# Patient Record
Sex: Female | Born: 1965 | Race: Black or African American | Hispanic: No | Marital: Single | Smoking: Never smoker
Health system: Southern US, Community
[De-identification: ages and names within clinical notes are randomized; demographics above are authoritative.]

## PROBLEM LIST (undated history)

## (undated) DIAGNOSIS — N809 Endometriosis, unspecified: Secondary | ICD-10-CM

## (undated) DIAGNOSIS — R002 Palpitations: Secondary | ICD-10-CM

## (undated) DIAGNOSIS — E559 Vitamin D deficiency, unspecified: Secondary | ICD-10-CM

## (undated) DIAGNOSIS — D219 Benign neoplasm of connective and other soft tissue, unspecified: Secondary | ICD-10-CM

## (undated) DIAGNOSIS — M1990 Unspecified osteoarthritis, unspecified site: Secondary | ICD-10-CM

## (undated) DIAGNOSIS — D649 Anemia, unspecified: Secondary | ICD-10-CM

## (undated) HISTORY — DX: Vitamin D deficiency, unspecified: E55.9

## (undated) HISTORY — DX: Benign neoplasm of connective and other soft tissue, unspecified: D21.9

## (undated) HISTORY — PX: ABDOMINAL HYSTERECTOMY: SHX81

## (undated) HISTORY — DX: Unspecified osteoarthritis, unspecified site: M19.90

## (undated) HISTORY — DX: Anemia, unspecified: D64.9

## (undated) HISTORY — DX: Endometriosis, unspecified: N80.9

## (undated) HISTORY — PX: GALLBLADDER SURGERY: SHX652

## (undated) HISTORY — DX: Palpitations: R00.2

---

## 2004-09-20 ENCOUNTER — Emergency Department: Payer: Self-pay | Admitting: Emergency Medicine

## 2005-07-16 ENCOUNTER — Emergency Department: Payer: Self-pay | Admitting: Emergency Medicine

## 2009-12-04 ENCOUNTER — Emergency Department: Payer: Self-pay | Admitting: Emergency Medicine

## 2011-02-09 ENCOUNTER — Ambulatory Visit: Payer: Self-pay | Admitting: Family Medicine

## 2012-09-20 ENCOUNTER — Ambulatory Visit: Payer: Self-pay | Admitting: Emergency Medicine

## 2012-09-20 LAB — HEPATIC FUNCTION PANEL A (ARMC)
Albumin: 3.9 g/dL (ref 3.4–5.0)
Alkaline Phosphatase: 87 U/L (ref 50–136)
Bilirubin,Total: 0.6 mg/dL (ref 0.2–1.0)
SGOT(AST): 24 U/L (ref 15–37)
SGPT (ALT): 20 U/L (ref 12–78)

## 2012-09-20 LAB — HEMOGLOBIN: HGB: 11.1 g/dL — ABNORMAL LOW (ref 12.0–16.0)

## 2013-09-19 ENCOUNTER — Ambulatory Visit: Payer: Self-pay | Admitting: Gastroenterology

## 2015-03-03 NOTE — Op Note (Signed)
PATIENT NAME:  Emma Hall, HEIDEL MR#:  [id number] DATE OF BIRTH:  1966-01-22  DATE OF PROCEDURE:  09/20/2012  PREOPERATIVE DIAGNOSIS: Acute cholecystitis.   POSTOPERATIVE DIAGNOSIS: Acute cholecystitis.   OPERATION: Laparoscopic cholecystectomy.   SURGEON: Massa Pe S. Cleola Perryman, MD   HISTORY: This patient was seen by me with cholelithiasis and abdominal pain, right upper quadrant, was then brought to surgery.   PROCEDURE: Under general anesthesia the abdomen was then prepped and draped. A small incision was made above the umbilicus. After cutting skin and subcutaneous tissue, the fascia was cut. The abdomen was entered with a Hassan under direct vision. Another trocar was put in the epigastric region. Two 5 mm were put in the right upper quadrant of the abdomen. The gallbladder was enlarged. It was lifted up and the dissection was done to find out where the cystic artery was. After dissecting at the cystic duct and gallbladder junction, the cystic artery was then dissected out nicely and the cystic duct was also dissected out very nicely. The cystic artery was then clipped and cut. When we were doing the cholangiogram the needle poked through the other side of the gallbladder so then we reapplied the needle. It did leak out the contrast. I could not see anything so we decided that we could dissect out very nicely and make sure where the cystic duct enters. I could see the common duct on this lady and after I dissected out the cystic duct very nicely I put two clips proximally and one distally and then cut the cystic duct and gallbladder was then removed from the liver bed. Her preop liver functions were normal. Gallbladder came out with no evidence of any biliary leak. There was some bleeding from the tip of the cystic duct. I put a new clip on it because it looked small cystic artery was bleeding at the tip but it stopped and after washing out the gallbladder was okay and then gallbladder was removed from the  umbilical port. After this was all done I made sure all the ports were not bleeding and then we closed the umbilical port with interrupted 0 Vicryl sutures and staples applied.     The patient tolerated the procedure well and was sent to the recovery room in satisfactory condition.   ____________________________ Welford Roche Phylis Bougie, MD msh:drc D: 09/20/2012 12:30:10 ET T: 09/20/2012 13:21:30 ET JOB#: [id number]  cc: Sanjith Siwek S. Phylis Bougie, MD, <Dictator> Sharene Butters MD ELECTRONICALLY SIGNED 09/21/2012 15:10

## 2017-12-12 ENCOUNTER — Ambulatory Visit (INDEPENDENT_AMBULATORY_CARE_PROVIDER_SITE_OTHER): Payer: BC Managed Care – PPO | Admitting: Obstetrics and Gynecology

## 2017-12-12 ENCOUNTER — Encounter: Payer: Self-pay | Admitting: Obstetrics and Gynecology

## 2017-12-12 VITALS — BP 127/78 | HR 87 | Ht 67.0 in | Wt 179.0 lb

## 2017-12-12 DIAGNOSIS — Z1231 Encounter for screening mammogram for malignant neoplasm of breast: Secondary | ICD-10-CM | POA: Diagnosis not present

## 2017-12-12 DIAGNOSIS — E663 Overweight: Secondary | ICD-10-CM

## 2017-12-12 DIAGNOSIS — Z01419 Encounter for gynecological examination (general) (routine) without abnormal findings: Secondary | ICD-10-CM | POA: Diagnosis not present

## 2017-12-12 DIAGNOSIS — K5901 Slow transit constipation: Secondary | ICD-10-CM

## 2017-12-12 DIAGNOSIS — Z79890 Hormone replacement therapy (postmenopausal): Secondary | ICD-10-CM | POA: Diagnosis not present

## 2017-12-12 DIAGNOSIS — Z13220 Encounter for screening for lipoid disorders: Secondary | ICD-10-CM | POA: Diagnosis not present

## 2017-12-12 DIAGNOSIS — Z1321 Encounter for screening for nutritional disorder: Secondary | ICD-10-CM | POA: Diagnosis not present

## 2017-12-12 NOTE — Patient Instructions (Addendum)
Health Maintenance for Postmenopausal Women Menopause is a normal process in which your reproductive ability comes to an end. This process happens gradually over a span of months to years, usually between the ages of 22 and 9. Menopause is complete when you have missed 12 consecutive menstrual periods. It is important to talk with your health care provider about some of the most common conditions that affect postmenopausal women, such as heart disease, cancer, and bone loss (osteoporosis). Adopting a healthy lifestyle and getting preventive care can help to promote your health and wellness. Those actions can also lower your chances of developing some of these common conditions. What should I know about menopause? During menopause, you may experience a number of symptoms, such as:  Moderate-to-severe hot flashes.  Night sweats.  Decrease in sex drive.  Mood swings.  Headaches.  Tiredness.  Irritability.  Memory problems.  Insomnia.  Choosing to treat or not to treat menopausal changes is an individual decision that you make with your health care provider. What should I know about hormone replacement therapy and supplements? Hormone therapy products are effective for treating symptoms that are associated with menopause, such as hot flashes and night sweats. Hormone replacement carries certain risks, especially as you become older. If you are thinking about using estrogen or estrogen with progestin treatments, discuss the benefits and risks with your health care provider. What should I know about heart disease and stroke? Heart disease, heart attack, and stroke become more likely as you age. This may be due, in part, to the hormonal changes that your body experiences during menopause. These can affect how your body processes dietary fats, triglycerides, and cholesterol. Heart attack and stroke are both medical emergencies. There are many things that you can do to help prevent heart disease  and stroke:  Have your blood pressure checked at least every 1-2 years. High blood pressure causes heart disease and increases the risk of stroke.  If you are 53-22 years old, ask your health care provider if you should take aspirin to prevent a heart attack or a stroke.  Do not use any tobacco products, including cigarettes, chewing tobacco, or electronic cigarettes. If you need help quitting, ask your health care provider.  It is important to eat a healthy diet and maintain a healthy weight. ? Be sure to include plenty of vegetables, fruits, low-fat dairy products, and lean protein. ? Avoid eating foods that are high in solid fats, added sugars, or salt (sodium).  Get regular exercise. This is one of the most important things that you can do for your health. ? Try to exercise for at least 150 minutes each week. The type of exercise that you do should increase your heart rate and make you sweat. This is known as moderate-intensity exercise. ? Try to do strengthening exercises at least twice each week. Do these in addition to the moderate-intensity exercise.  Know your numbers.Ask your health care provider to check your cholesterol and your blood glucose. Continue to have your blood tested as directed by your health care provider.  What should I know about cancer screening? There are several types of cancer. Take the following steps to reduce your risk and to catch any cancer development as early as possible. Breast Cancer  Practice breast self-awareness. ? This means understanding how your breasts normally appear and feel. ? It also means doing regular breast self-exams. Let your health care provider know about any changes, no matter how small.  If you are 40  or older, have a clinician do a breast exam (clinical breast exam or CBE) every year. Depending on your age, family history, and medical history, it may be recommended that you also have a yearly breast X-ray (mammogram).  If you  have a family history of breast cancer, talk with your health care provider about genetic screening.  If you are at high risk for breast cancer, talk with your health care provider about having an MRI and a mammogram every year.  Breast cancer (BRCA) gene test is recommended for women who have family members with BRCA-related cancers. Results of the assessment will determine the need for genetic counseling and BRCA1 and for BRCA2 testing. BRCA-related cancers include these types: ? Breast. This occurs in males or females. ? Ovarian. ? Tubal. This may also be called fallopian tube cancer. ? Cancer of the abdominal or pelvic lining (peritoneal cancer). ? Prostate. ? Pancreatic.  Cervical, Uterine, and Ovarian Cancer Your health care provider may recommend that you be screened regularly for cancer of the pelvic organs. These include your ovaries, uterus, and vagina. This screening involves a pelvic exam, which includes checking for microscopic changes to the surface of your cervix (Pap test).  For women ages 21-65, health care providers may recommend a pelvic exam and a Pap test every three years. For women ages 79-65, they may recommend the Pap test and pelvic exam, combined with testing for human papilloma virus (HPV), every five years. Some types of HPV increase your risk of cervical cancer. Testing for HPV may also be done on women of any age who have unclear Pap test results.  Other health care providers may not recommend any screening for nonpregnant women who are considered low risk for pelvic cancer and have no symptoms. Ask your health care provider if a screening pelvic exam is right for you.  If you have had past treatment for cervical cancer or a condition that could lead to cancer, you need Pap tests and screening for cancer for at least 20 years after your treatment. If Pap tests have been discontinued for you, your risk factors (such as having a new sexual partner) need to be  reassessed to determine if you should start having screenings again. Some women have medical problems that increase the chance of getting cervical cancer. In these cases, your health care provider may recommend that you have screening and Pap tests more often.  If you have a family history of uterine cancer or ovarian cancer, talk with your health care provider about genetic screening.  If you have vaginal bleeding after reaching menopause, tell your health care provider.  There are currently no reliable tests available to screen for ovarian cancer.  Lung Cancer Lung cancer screening is recommended for adults 69-62 years old who are at high risk for lung cancer because of a history of smoking. A yearly low-dose CT scan of the lungs is recommended if you:  Currently smoke.  Have a history of at least 30 pack-years of smoking and you currently smoke or have quit within the past 15 years. A pack-year is smoking an average of one pack of cigarettes per day for one year.  Yearly screening should:  Continue until it has been 15 years since you quit.  Stop if you develop a health problem that would prevent you from having lung cancer treatment.  Colorectal Cancer  This type of cancer can be detected and can often be prevented.  Routine colorectal cancer screening usually begins at  age 42 and continues through age 45.  If you have risk factors for colon cancer, your health care provider may recommend that you be screened at an earlier age.  If you have a family history of colorectal cancer, talk with your health care provider about genetic screening.  Your health care provider may also recommend using home test kits to check for hidden blood in your stool.  A small camera at the end of a tube can be used to examine your colon directly (sigmoidoscopy or colonoscopy). This is done to check for the earliest forms of colorectal cancer.  Direct examination of the colon should be repeated every  5-10 years until age 71. However, if early forms of precancerous polyps or small growths are found or if you have a family history or genetic risk for colorectal cancer, you may need to be screened more often.  Skin Cancer  Check your skin from head to toe regularly.  Monitor any moles. Be sure to tell your health care provider: ? About any new moles or changes in moles, especially if there is a change in a mole's shape or color. ? If you have a mole that is larger than the size of a pencil eraser.  If any of your family members has a history of skin cancer, especially at a young age, talk with your health care provider about genetic screening.  Always use sunscreen. Apply sunscreen liberally and repeatedly throughout the day.  Whenever you are outside, protect yourself by wearing long sleeves, pants, a wide-brimmed hat, and sunglasses.  What should I know about osteoporosis? Osteoporosis is a condition in which bone destruction happens more quickly than new bone creation. After menopause, you may be at an increased risk for osteoporosis. To help prevent osteoporosis or the bone fractures that can happen because of osteoporosis, the following is recommended:  If you are 46-71 years old, get at least 1,000 mg of calcium and at least 600 mg of vitamin D per day.  If you are older than age 55 but younger than age 65, get at least 1,200 mg of calcium and at least 600 mg of vitamin D per day.  If you are older than age 54, get at least 1,200 mg of calcium and at least 800 mg of vitamin D per day.  Smoking and excessive alcohol intake increase the risk of osteoporosis. Eat foods that are rich in calcium and vitamin D, and do weight-bearing exercises several times each week as directed by your health care provider. What should I know about how menopause affects my mental health? Depression may occur at any age, but it is more common as you become older. Common symptoms of depression  include:  Low or sad mood.  Changes in sleep patterns.  Changes in appetite or eating patterns.  Feeling an overall lack of motivation or enjoyment of activities that you previously enjoyed.  Frequent crying spells.  Talk with your health care provider if you think that you are experiencing depression. What should I know about immunizations? It is important that you get and maintain your immunizations. These include:  Tetanus, diphtheria, and pertussis (Tdap) booster vaccine.  Influenza every year before the flu season begins.  Pneumonia vaccine.  Shingles vaccine.  Your health care provider may also recommend other immunizations. This information is not intended to replace advice given to you by your health care provider. Make sure you discuss any questions you have with your health care provider. Document Released: 12/23/2005  Document Revised: 05/20/2016 Document Reviewed: 08/04/2015 Elsevier Interactive Patient Education  2018 [employer]  

## 2017-12-12 NOTE — Progress Notes (Signed)
ANNUAL PREVENTATIVE CARE GYNECOLOGY  ENCOUNTER NOTE  Subjective:       Emma Hall is a 52 y.o. G2P2 female here to establish care, and for a routine annual gynecologic exam. The patient is sexually active. The patient is taking hormone replacement therapy (OTC Estroven daily, x 1 year). Patient denies post-menopausal vaginal bleeding. The patient wears seatbelts: yes. The patient participates in regular exercise: no. Has the patient ever been transfused or tattooed?: no. The patient reports that there is not domestic violence in her life.  Current complaints: 1.  Constipation - she reports that she has dealt with chronic constipation over the past 2 years or so.  She reports that she usually takes fiber tablets and drinks water, which allows her to have a BM ever 2-3 days.  If she does not take the supplements, she may go up to 1 week before having a BM.     Gynecologic History No LMP recorded. Patient has had a hysterectomy. Contraception: status post hysterectomy Last Pap: 2015. Results were: normal Last mammogram: 2015. Results were: normal Last Colonoscopy: ~ 5 or 6 years ago. Results were: normal.   Obstetric History OB History  Gravida Para Term Preterm AB Living  2 2          SAB TAB Ectopic Multiple Live Births               # Outcome Date GA Lbr Len/2nd Weight Sex Delivery Anes PTL Lv  2 Para 1991    F CS-LTranv     1 Para 1989    M CS-LTranv         Past Medical History:  Diagnosis Date  . Anemia   . Endometriosis   . Fibroid     History reviewed. No pertinent family history.  Past Surgical History:  Procedure Laterality Date  . ABDOMINAL HYSTERECTOMY    . GALLBLADDER SURGERY      Social History   Socioeconomic History  . Marital status: Married    Spouse name: Not on file  . Number of children: Not on file  . Years of education: Not on file  . Highest education level: Not on file  Social Needs  . Financial resource strain: Not on file  .  Food insecurity - worry: Not on file  . Food insecurity - inability: Not on file  . Transportation needs - medical: Not on file  . Transportation needs - non-medical: Not on file  Occupational History  . Not on file  Tobacco Use  . Smoking status: Never Smoker  . Smokeless tobacco: Never Used  Substance and Sexual Activity  . Alcohol use: No    Frequency: Never  . Drug use: No  . Sexual activity: No  Other Topics Concern  . Not on file  Social History Narrative  . Not on file    Current Outpatient Medications on File Prior to Visit  Medication Sig Dispense Refill  . ferrous sulfate 325 (65 FE) MG EC tablet Take 325 mg by mouth 3 (three) times daily with meals.     No current facility-administered medications on file prior to visit.     No Known Allergies    Review of Systems ROS Review of Systems - General ROS: negative for - chills, fatigue, fever, hot flashes, night sweats, weight gain or weight loss Psychological ROS: negative for - anxiety, decreased libido, depression, mood swings, physical abuse or sexual abuse Ophthalmic ROS: negative for - blurry vision, eye pain or  loss of vision ENT ROS: negative for - headaches, hearing change, visual changes or vocal changes Allergy and Immunology ROS: negative for - hives, itchy/watery eyes or seasonal allergies Hematological and Lymphatic ROS: negative for - bleeding problems, bruising, swollen lymph nodes or weight loss Endocrine ROS: negative for - galactorrhea, hair pattern changes, hot flashes, malaise/lethargy, mood swings, palpitations, polydipsia/polyuria, skin changes, temperature intolerance or unexpected weight changes Breast ROS: negative for - new or changing breast lumps or nipple discharge Respiratory ROS: negative for - cough or shortness of breath Cardiovascular ROS: negative for - chest pain, irregular heartbeat, palpitations or shortness of breath Gastrointestinal ROS: no abdominal pain, change in bowel  habits, or black or bloody stools.  Positive for constipation (see HPI) Genito-Urinary ROS: no dysuria, trouble voiding, or hematuria Musculoskeletal ROS: negative for - joint pain or joint stiffness Neurological ROS: negative for - bowel and bladder control changes Dermatological ROS: negative for rash and skin lesion changes   Objective:   BP 127/78   Pulse 87   Ht 5\' 7"  (1.702 m)   Wt 179 lb (81.2 kg)   BMI 28.04 kg/m  CONSTITUTIONAL: Well-developed, well-nourished female in no acute distress. Overweight.  PSYCHIATRIC: Normal mood and affect. Normal behavior. Normal judgment and thought content. Thomas: Alert and oriented to person, place, and time. Normal muscle tone coordination. No cranial nerve deficit noted. HENT:  Normocephalic, atraumatic, External right and left ear normal. Oropharynx is clear and moist EYES: Conjunctivae and EOM are normal. Pupils are equal, round, and reactive to light. No scleral icterus.  NECK: Normal range of motion, supple, no masses.  Normal thyroid.  SKIN: Skin is warm and dry. No rash noted. Not diaphoretic. No erythema. No pallor. CARDIOVASCULAR: Normal heart rate noted, regular rhythm, no murmur. RESPIRATORY: Clear to auscultation bilaterally. Effort and breath sounds normal, no problems with respiration noted. BREASTS: Symmetric in size. No masses, skin changes, nipple drainage, or lymphadenopathy. ABDOMEN: Soft, normal bowel sounds, no distention noted.  No tenderness, rebound or guarding.  BLADDER: Normal PELVIC:  Bladder no bladder distension noted  Urethra: normal appearing urethra with no masses, tenderness or lesions  Vulva: normal appearing vulva with no masses, tenderness or lesions  Vagina: normal appearing vagina with normal color and discharge, no lesions  Cervix: normal appearing cervix without discharge or lesions  Uterus: uterus is normal size, shape, consistency and nontender  Adnexa: normal adnexa in size, nontender and no  masses  RV: External Exam NormaI, No Rectal Masses and Normal Sphincter tone  MUSCULOSKELETAL: Normal range of motion. No tenderness.  No cyanosis, clubbing, or edema.  2+ distal pulses. LYMPHATIC: No Axillary, Supraclavicular, or Inguinal Adenopathy.   Labs: Lab Results  Component Value Date   HGB 11.1 (L) 09/20/2012    No results found for: CREATININE, BUN, NA, K, CL, CO2  Lab Results  Component Value Date   ALT 20 09/20/2012   AST 24 09/20/2012   ALKPHOS 87 09/20/2012   BILITOT 0.6 09/20/2012    No results found for: CHOL, HDL, LDLCALC, LDLDIRECT, TRIG, CHOLHDL  No results found for: TSH  No results found for: HGBA1C   Assessment:   Annual gynecologic examination 52 y.o. Contraception: post menopausal status Overweight  Breast cancer screening Cervical cancer screening Constipation Menopausal on HRT  Plan:  - Pap: Pap Co Test ordered. - Mammogram: Ordered - Stool Guaiac Testing:  Not Indicated. Patient up to date with colonoscopy.  - Labs: CBC, CMP, Lipid 1, TSH and Vit D Level""done -  Routine preventative health maintenance measures emphasized: Exercise/Diet/Weight control, Alcohol/Substance use risks and Stress Management - Constipation not relieved with conservative management. Also notes she has used laxatives as needed.  Will give sample of Linzess 145 mg daily for likely slow transit.  If this helps, can prescribe. Patient will call provider to notify if improvement.  - Menopausal, vasomotor symptoms controlled with OTC Estroven. Counseled on risks and benefits.  Patient has used only x 1 year.   - She has already received flu vaccine from work.  - Return to [place], MD [facility]

## 2017-12-13 LAB — CBC
Hematocrit: 32.6 % — ABNORMAL LOW (ref 34.0–46.6)
Hemoglobin: 10.9 g/dL — ABNORMAL LOW (ref 11.1–15.9)
MCH: 27.7 pg (ref 26.6–33.0)
MCHC: 33.4 g/dL (ref 31.5–35.7)
MCV: 83 fL (ref 79–97)
PLATELETS: 337 10*3/uL (ref 150–379)
RBC: 3.93 x10E6/uL (ref 3.77–5.28)
RDW: 14.9 % (ref 12.3–15.4)
WBC: 7.6 10*3/uL (ref 3.4–10.8)

## 2017-12-13 LAB — COMPREHENSIVE METABOLIC PANEL
A/G RATIO: 1.6 (ref 1.2–2.2)
ALK PHOS: 85 IU/L (ref 39–117)
ALT: 11 IU/L (ref 0–32)
AST: 15 IU/L (ref 0–40)
Albumin: 4.4 g/dL (ref 3.5–5.5)
BILIRUBIN TOTAL: 0.2 mg/dL (ref 0.0–1.2)
BUN/Creatinine Ratio: 16 (ref 9–23)
BUN: 13 mg/dL (ref 6–24)
CHLORIDE: 101 mmol/L (ref 96–106)
CO2: 23 mmol/L (ref 20–29)
Calcium: 9.6 mg/dL (ref 8.7–10.2)
Creatinine, Ser: 0.79 mg/dL (ref 0.57–1.00)
GFR calc non Af Amer: 88 mL/min/{1.73_m2} (ref >59)
GFR, EST AFRICAN AMERICAN: 101 mL/min/{1.73_m2} (ref >59)
GLUCOSE: 88 mg/dL (ref 65–99)
Globulin, Total: 2.7 g/dL (ref 1.5–4.5)
POTASSIUM: 4.1 mmol/L (ref 3.5–5.2)
Sodium: 142 mmol/L (ref 134–144)
TOTAL PROTEIN: 7.1 g/dL (ref 6.0–8.5)

## 2017-12-13 LAB — VITAMIN D 25 HYDROXY (VIT D DEFICIENCY, FRACTURES): Vit D, 25-Hydroxy: 30.1 ng/mL (ref 30.0–100.0)

## 2017-12-13 LAB — LIPID PANEL
Chol/HDL Ratio: 3.1 ratio (ref 0.0–4.4)
Cholesterol, Total: 177 mg/dL (ref 100–199)
HDL: 57 mg/dL (ref >39)
LDL Calculated: 103 mg/dL — ABNORMAL HIGH (ref 0–99)
Triglycerides: 83 mg/dL (ref 0–149)
VLDL CHOLESTEROL CAL: 17 mg/dL (ref 5–40)

## 2017-12-13 LAB — TSH: TSH: 1.32 u[IU]/mL (ref 0.450–4.500)

## 2017-12-14 DIAGNOSIS — E663 Overweight: Secondary | ICD-10-CM | POA: Insufficient documentation

## 2017-12-14 DIAGNOSIS — Z79890 Hormone replacement therapy (postmenopausal): Secondary | ICD-10-CM | POA: Insufficient documentation

## 2017-12-19 ENCOUNTER — Other Ambulatory Visit: Payer: Self-pay

## 2017-12-19 ENCOUNTER — Encounter: Payer: Self-pay | Admitting: Obstetrics and Gynecology

## 2017-12-19 MED ORDER — LINACLOTIDE 145 MCG PO CAPS: 145.0000 ug | ORAL_CAPSULE | Freq: Every day | ORAL | 3 refills | Status: DC

## 2018-01-09 ENCOUNTER — Encounter: Payer: Self-pay | Admitting: Obstetrics and Gynecology

## 2018-01-09 ENCOUNTER — Ambulatory Visit (INDEPENDENT_AMBULATORY_CARE_PROVIDER_SITE_OTHER): Payer: BC Managed Care – PPO | Admitting: Obstetrics and Gynecology

## 2018-01-09 VITALS — BP 101/69 | HR 76 | Wt 177.9 lb

## 2018-01-09 DIAGNOSIS — M25551 Pain in right hip: Secondary | ICD-10-CM

## 2018-01-09 DIAGNOSIS — Z23 Encounter for immunization: Secondary | ICD-10-CM

## 2018-01-09 DIAGNOSIS — K5901 Slow transit constipation: Secondary | ICD-10-CM

## 2018-01-09 NOTE — Progress Notes (Signed)
Pt is doing well. Pt stated that the linzess is working.

## 2018-01-09 NOTE — Addendum Note (Signed)
Addended by: Edwyna Shell on: 01/09/2018 09:52 AM   Modules accepted: Orders

## 2018-01-09 NOTE — Progress Notes (Signed)
    GYNECOLOGY PROGRESS NOTE  Subjective:    Patient ID: Emma Hall, female    DOB: 1966/09/30, 52 y.o.   MRN: [id number]  HPI  Patient is a 52 y.o. G2P2 female who presents for f/u of constipation.  She had complaints of slow transit constipation (usually not going for 5-7 days).  She was started on Linzess last visit, which she notes she is doing well with.  She is now having daily bowel movements.    The following portions of the patient's history were reviewed and updated as appropriate: allergies, current medications, past family history, past medical history, past social history, past surgical history and problem list.  Review of Systems A comprehensive review of systems was negative except for: Musculoskeletal: positive for right hip pain, intermittent.  Usually lasts for several minutes and then goes away.  Has been going on for the past 6 months. Not associated with any particular time of day.   Objective:   Blood pressure 101/69, pulse 76, weight 177 lb 14.4 oz (80.7 kg). General appearance: alert and no distress Musculoskeletal: ROM normal, no hip clicks or pain elicited.  Extremities: extremities normal, atraumatic, no cyanosis or edema Neurologic: Grossly normal     Assessment:   Constipation (slow transit) Right hip pain  Plan:   - Constipation currently managed with Linzess.  Has received prescription. To continue.  - Right hip pain, intermittent. Discussed stretching, weight-bearing exercises.  If no relief, will refer to PCP for further evaluation.   Rubie Maid, MD [facility]

## 2018-06-19 ENCOUNTER — Encounter: Payer: Self-pay | Admitting: Obstetrics and Gynecology

## 2018-11-19 ENCOUNTER — Other Ambulatory Visit: Payer: Self-pay | Admitting: Obstetrics and Gynecology

## 2018-11-19 NOTE — Telephone Encounter (Signed)
Pt sent a mychart message stating that she needed the refill and was still need the medication.

## 2018-11-19 NOTE — Telephone Encounter (Signed)
Can we see if this patient still needs this medication.  I see she has an appointment with me in February.

## 2018-11-19 NOTE — Telephone Encounter (Signed)
Pt called no answer LM via voicemail to contact the office to let someone know if she wanted to continue taking the medication or she could send a mychart message.

## 2018-12-18 ENCOUNTER — Encounter: Payer: BC Managed Care – PPO | Admitting: Obstetrics and Gynecology

## 2018-12-26 ENCOUNTER — Encounter: Payer: BC Managed Care – PPO | Admitting: Obstetrics and Gynecology

## 2019-01-08 ENCOUNTER — Encounter: Payer: Self-pay | Admitting: Obstetrics and Gynecology

## 2019-01-08 ENCOUNTER — Ambulatory Visit (INDEPENDENT_AMBULATORY_CARE_PROVIDER_SITE_OTHER): Payer: BC Managed Care – PPO | Admitting: Obstetrics and Gynecology

## 2019-01-08 VITALS — BP 107/76 | HR 104 | Ht 67.0 in | Wt 183.5 lb

## 2019-01-08 DIAGNOSIS — Z01419 Encounter for gynecological examination (general) (routine) without abnormal findings: Secondary | ICD-10-CM

## 2019-01-08 DIAGNOSIS — Z23 Encounter for immunization: Secondary | ICD-10-CM

## 2019-01-08 DIAGNOSIS — Z1211 Encounter for screening for malignant neoplasm of colon: Secondary | ICD-10-CM

## 2019-01-08 DIAGNOSIS — E785 Hyperlipidemia, unspecified: Secondary | ICD-10-CM

## 2019-01-08 DIAGNOSIS — Z780 Asymptomatic menopausal state: Secondary | ICD-10-CM

## 2019-01-08 DIAGNOSIS — M722 Plantar fascial fibromatosis: Secondary | ICD-10-CM

## 2019-01-08 DIAGNOSIS — E663 Overweight: Secondary | ICD-10-CM

## 2019-01-08 NOTE — Progress Notes (Signed)
ANNUAL PREVENTATIVE CARE GYNECOLOGY  ENCOUNTER NOTE  Subjective:       Emma Hall is a 53 y.o. G2P2 female here to establish care, and for a routine annual gynecologic exam. The patient is sexually active. The patient is taking hormone replacement therapy (OTC Estroven daily, x 2 years). Patient denies post-menopausal vaginal bleeding. The patient wears seatbelts: yes. The patient participates in regular exercise: (yes, sometimes, althouhgh not as much lately due to plantar fasciitis). Has the patient ever been transfused or tattooed?: no. The patient reports that there is not domestic violence in her life.  Current complaints: 1.  Notes that it is becoming more difficult for her to lose weight. Is not able to exercise as much also due to issues with her feet (has been seen at urgent care a few times, diagnosed with plantar fasciitis).     Gynecologic History No LMP recorded. Patient has had a hysterectomy. Contraception: status post hysterectomy Last Pap: 2015. Results were: normal Last mammogram: 2015. Results were: normal Last Colonoscopy: ~ 6 years ago. Results were: normal per patient. PCP: None   Obstetric History OB History  Gravida Para Term Preterm AB Living  2 2          SAB TAB Ectopic Multiple Live Births               # Outcome Date GA Lbr Len/2nd Weight Sex Delivery Anes PTL Lv  2 Para 1991    F CS-LTranv     1 Para 1989    M CS-LTranv       Past Medical History:  Diagnosis Date  . Anemia   . Endometriosis   . Fibroid     Family History  Problem Relation Age of Onset  . Healthy Mother   . Healthy Father     Past Surgical History:  Procedure Laterality Date  . ABDOMINAL HYSTERECTOMY    . GALLBLADDER SURGERY      Social History   Socioeconomic History  . Marital status: Single    Spouse name: Not on file  . Number of children: Not on file  . Years of education: Not on file  . Highest education level: Not on file  Occupational History  .  Not on file  Social Needs  . Financial resource strain: Not on file  . Food insecurity:    Worry: Not on file    Inability: Not on file  . Transportation needs:    Medical: Not on file    Non-medical: Not on file  Tobacco Use  . Smoking status: Never Smoker  . Smokeless tobacco: Never Used  Substance and Sexual Activity  . Alcohol use: Yes    Frequency: Never    Comment: occass  . Drug use: No  . Sexual activity: Never    Birth control/protection: Surgical  Lifestyle  . Physical activity:    Days per week: Not on file    Minutes per session: Not on file  . Stress: Not on file  Relationships  . Social connections:    Talks on phone: Not on file    Gets together: Not on file    Attends religious service: Not on file    Active member of club or organization: Not on file    Attends meetings of clubs or organizations: Not on file    Relationship status: Not on file  . Intimate partner violence:    Fear of current or ex partner: Not on file  Emotionally abused: Not on file    Physically abused: Not on file    Forced sexual activity: Not on file  Other Topics Concern  . Not on file  Social History Narrative  . Not on file    Current Outpatient Medications on File Prior to Visit  Medication Sig Dispense Refill  . ferrous sulfate 325 (65 FE) MG EC tablet Take 325 mg by mouth 3 (three) times daily with meals.    Marland Kitchen LINZESS 145 MCG CAPS capsule TAKE 1 CAPSULE(145 MCG) BY MOUTH DAILY BEFORE BREAKFAST 30 capsule 1   No current facility-administered medications on file prior to visit.     No Known Allergies    Review of Systems ROS Review of Systems - General ROS: negative for - chills, fatigue, fever, hot flashes, night sweats, weight gain or weight loss Psychological ROS: negative for - anxiety, decreased libido, depression, mood swings, physical abuse or sexual abuse Ophthalmic ROS: negative for - blurry vision, eye pain or loss of vision ENT ROS: negative for -  headaches, hearing change, visual changes or vocal changes Allergy and Immunology ROS: negative for - hives, itchy/watery eyes or seasonal allergies Hematological and Lymphatic ROS: negative for - bleeding problems, bruising, swollen lymph nodes or weight loss Endocrine ROS: negative for - galactorrhea, hair pattern changes, hot flashes, malaise/lethargy, mood swings, palpitations, polydipsia/polyuria, skin changes, temperature intolerance or unexpected weight changes Breast ROS: negative for - new or changing breast lumps or nipple discharge Respiratory ROS: negative for - cough or shortness of breath Cardiovascular ROS: negative for - chest pain, irregular heartbeat, palpitations or shortness of breath Gastrointestinal ROS: no abdominal pain, change in bowel habits, or black or bloody stools.  Genito-Urinary ROS: no dysuria, trouble voiding, or hematuria Musculoskeletal ROS: negative for - joint pain or joint stiffness. Positive for left foot pain with mobility (plantar fasciitis).  Neurological ROS: negative for - bowel and bladder control changes Dermatological ROS: negative for rash and skin lesion changes   Objective:   BP 107/76   Pulse (!) 104   Ht 5\' 7"  (1.702 m)   Wt 183 lb 8 oz (83.2 kg)   BMI 28.74 kg/m  CONSTITUTIONAL: Well-developed, well-nourished female in no acute distress. Overweight.  PSYCHIATRIC: Normal mood and affect. Normal behavior. Normal judgment and thought content. [place]: Alert and oriented to person, place, and time. Normal muscle tone coordination. No cranial nerve deficit noted. HENT:  Normocephalic, atraumatic, External right and left ear normal. Oropharynx is clear and moist EYES: Conjunctivae and EOM are normal. Pupils are equal, round, and reactive to light. No scleral icterus.  NECK: Normal range of motion, supple, no masses.  Normal thyroid.  SKIN: Skin is warm and dry. No rash noted. Not diaphoretic. No erythema. No pallor. CARDIOVASCULAR: Normal  heart rate noted, regular rhythm, no murmur. RESPIRATORY: Clear to auscultation bilaterally. Effort and breath sounds normal, no problems with respiration noted. BREASTS: Symmetric in size. No masses, skin changes, nipple drainage, or lymphadenopathy. ABDOMEN: Soft, normal bowel sounds, no distention noted.  No tenderness, rebound or guarding.  BLADDER: Normal PELVIC:  Bladder no bladder distension noted  Urethra: normal appearing urethra with no masses, tenderness or lesions  Vulva: normal appearing vulva with no masses, tenderness or lesions  Vagina: normal appearing vagina with normal color and discharge, no lesions  Cervix: normal appearing cervix without discharge or lesions  Uterus: uterus is normal size, shape, consistency and nontender  Adnexa: normal adnexa in size, nontender and no masses  RV: External  Exam NormaI, No Rectal Masses and Normal Sphincter tone  MUSCULOSKELETAL: Normal range of motion. No tenderness.  No cyanosis, clubbing, or edema.  2+ distal pulses. LYMPHATIC: No Axillary, Supraclavicular, or Inguinal Adenopathy.   Labs: Lab Results  Component Value Date   WBC 7.6 12/12/2017   HGB 10.9 (L) 12/12/2017   HCT 32.6 (L) 12/12/2017   MCV 83 12/12/2017   PLT 337 12/12/2017    Lab Results  Component Value Date   CREATININE 0.79 12/12/2017   BUN 13 12/12/2017   NA 142 12/12/2017   K 4.1 12/12/2017   CL 101 12/12/2017   CO2 23 12/12/2017    Lab Results  Component Value Date   ALT 11 12/12/2017   AST 15 12/12/2017   ALKPHOS 85 12/12/2017   BILITOT 0.2 12/12/2017    Lab Results  Component Value Date   CHOL 177 12/12/2017   HDL 57 12/12/2017   LDLCALC 103 (H) 12/12/2017   TRIG 83 12/12/2017   CHOLHDL 3.1 12/12/2017    Lab Results  Component Value Date   TSH 1.320 12/12/2017    No results found for: HGBA1C   Assessment:   Annual gynecologic examination 53 y.o. Contraception: post menopausal status Overweight  Breast cancer  screening Menopausal on HRT History of anemia Dyslipidemia Plantar fasciitis  Plan:  - Pap: Not needed. Patient is s/p hysterectomy . - Mammogram: Ordered - Stool Guaiac Testing:  Ordered colonoscopy.  - Labs: CBC, CMP.   - Routine preventative health maintenance measures emphasized: Exercise/Diet/Weight control, Alcohol/Substance use risks and Stress Management - Menopausal, vasomotor symptoms controlled with OTC Estroven. Counseled on risks and benefits.  Patient has used only x 2 years.   - History of anemia, no known cause. Will follow up. If still anemic despite use of supplemental iron, will send for Hematology workup.  - Dyslipidemia very mild, will follow up in 1 year.  - She has already received flu vaccine from work.  - Offered referral for patient's plantar fasciitis.  Patient desires.  - Return to [place]

## 2019-01-08 NOTE — Progress Notes (Signed)
PT is present today for her annual exam. Pt stated that she has been doing self-breast exams monthly. Pt stated that she is doing well and denies any issues. No problems or concerns.     

## 2019-01-08 NOTE — Patient Instructions (Addendum)
Influenza (Flu) Vaccine (Inactivated or Recombinant): What You Need to Know 1. Why get vaccinated? Influenza vaccine can prevent influenza (flu). Flu is a contagious disease that spreads around the [place] every year, usually between October and May. Anyone can get the flu, but it is more dangerous for some people. Infants and young children, people 52 years of age and older, pregnant women, and people with certain health conditions or a weakened immune system are at greatest risk of flu complications. Pneumonia, bronchitis, sinus infections and ear infections are examples of flu-related complications. If you have a medical condition, such as heart disease, cancer or diabetes, flu can make it worse. Flu can cause fever and chills, sore throat, muscle aches, fatigue, cough, headache, and runny or stuffy nose. Some people may have vomiting and diarrhea, though this is more common in children than adults. Each year thousands of people in the [place] die from flu, and many more are hospitalized. Flu vaccine prevents millions of illnesses and flu-related visits to the doctor each year. 2. Influenza vaccine CDC recommends everyone 6 months of age and older get vaccinated every flu season. Children 6 months through 8 years of age may need 2 doses during a single flu season. Everyone else needs only 1 dose each flu season. It takes about 2 weeks for protection to develop after vaccination. There are many flu viruses, and they are always changing. Each year a new flu vaccine is made to protect against three or four viruses that are likely to cause disease in the upcoming flu season. Even when the vaccine doesn't exactly match these viruses, it may still provide some protection. Influenza vaccine does not cause flu. Influenza vaccine may be given at the same time as other vaccines. 3. Talk with your health care provider Tell your vaccine provider if the person getting the vaccine:  Has had an  allergic reaction after a previous dose of influenza vaccine, or has any severe, life-threatening allergies.  Has ever had Guillain-Barr Syndrome (also called GBS). In some cases, your health care provider may decide to postpone influenza vaccination to a future visit. People with minor illnesses, such as a cold, may be vaccinated. People who are moderately or severely ill should usually wait until they recover before getting influenza vaccine. Your health care provider can give you more information. 4. Risks of a vaccine reaction  Soreness, redness, and swelling where shot is given, fever, muscle aches, and headache can happen after influenza vaccine.  There may be a very small increased risk of Guillain-Barr Syndrome (GBS) after inactivated influenza vaccine (the flu shot). Young children who get the flu shot along with pneumococcal vaccine (PCV13), and/or DTaP vaccine at the same time might be slightly more likely to have a seizure caused by fever. Tell your health care provider if a child who is getting flu vaccine has ever had a seizure. People sometimes faint after medical procedures, including vaccination. Tell your provider if you feel dizzy or have vision changes or ringing in the ears. As with any medicine, there is a very remote chance of a vaccine causing a severe allergic reaction, other serious injury, or death. 5. What if there is a serious problem? An allergic reaction could occur after the vaccinated person leaves the clinic. If you see signs of a severe allergic reaction (hives, swelling of the face and throat, difficulty breathing, a fast heartbeat, dizziness, or weakness), call 9-1-1 and get the person to the nearest hospital. For other signs that   concern you, call your health care provider. Adverse reactions should be reported to the Vaccine Adverse Event Reporting System (VAERS). Your health care provider will usually file this report, or you can do it yourself. Visit the  VAERS website at www.vaers.SamedayNews.es or call 4123595845.VAERS is only for reporting reactions, and VAERS staff do not give medical advice. 6. The National Vaccine Injury Compensation Program The [employer] Vaccine Injury Compensation Program (VICP) is a federal program that was created to compensate people who may have been injured by certain vaccines. Visit the VICP website at GoldCloset.com.ee or call 726-229-4943 to learn about the program and about filing a claim. There is a time limit to file a claim for compensation. 7. How can I learn more?  Ask your healthcare provider.  Call your local or state health department.  Contact the Centers for Disease Control and Prevention (CDC): ? Call (305)193-9764 (1-800-CDC-INFO) or ? Visit CDC's https://gibson.com/ Vaccine Information Statement (Interim) Inactivated Influenza Vaccine (06/28/2018) This information is not intended to replace advice given to you by your health care provider. Make sure you discuss any questions you have with your health care provider. Document Released: 08/25/2006 Document Revised: 07/02/2018 Document Reviewed: 07/02/2018 Elsevier Interactive Patient Education  2019 [place] Breast self-awareness means:  Knowing how your breasts look.  Knowing how your breasts feel.  Checking your breasts every month for changes.  Telling your doctor if you notice a change in your breasts. Breast self-awareness allows you to notice a breast problem early while it is still small. How to do a breast self-exam One way to learn what is normal for your breasts and to check for changes is to do a breast self-exam. To do a breast self-exam: Look for Changes  1. Take off all the clothes above your waist. 2. Stand in front of a mirror in a room with good lighting. 3. Put your hands on your hips. 4. Push your hands down. 5. Look at your breasts and nipples in the mirror to see if one breast or  nipple looks different than the other. Check to see if: ? The shape of one breast is different. ? The size of one breast is different. ? There are wrinkles, dips, and bumps in one breast and not the other. 6. Look at each breast for changes in your skin, such as: ? Redness. ? Scaly areas. 7. Look for changes in your nipples, such as: ? Liquid around the nipples. ? Bleeding. ? Dimpling. ? Redness. ? A change in where the nipples are. Feel for Changes 1. Lie on your back on the floor. 2. Feel each breast. To do this, follow these steps: ? Pick a breast to feel. ? Put the arm closest to that breast above your head. ? Use your other arm to feel the nipple area of your breast. Feel the area with the pads of your three middle fingers by making small circles with your fingers. For the first circle, press lightly. For the second circle, press harder. For the third circle, press even harder. ? Keep making circles with your fingers at the light, harder, and even harder pressures as you move down your breast. Stop when you feel your ribs. ? Move your fingers a little toward the center of your body. ? Start making circles with your fingers again, this time going up until you reach your collarbone. ? Keep making up and down circles until you reach your armpit. Remember to keep using the three pressures. ?  Feel the other breast in the same way. 3. Sit or stand in the shower or tub. 4. With soapy water on your skin, feel each breast the same way you did in step 2, when you were lying on the floor. Write Down What You Find After doing the self-exam, write down:  What is normal for each breast.  Any changes you find in each breast.  When you last had your period.  How often should I check my breasts? Check your breasts every month. If you are breastfeeding, the best time to check them is after you feed your baby or after you use a breast pump. If you get periods, the best time to check your  breasts is 5-7 days after your period is over. When should I see my doctor? See your doctor if you notice:  A change in shape or size of your breasts or nipples.  A change in the skin of your breast or nipples, such as red or scaly skin.  Unusual fluid coming from your nipples.  A lump or thick area that was not there before.  Pain in your breasts.  Anything that concerns you. This information is not intended to replace advice given to you by your health care provider. Make sure you discuss any questions you have with your health care provider. Document Released: 04/18/2008 Document Revised: 04/07/2016 Document Reviewed: 09/20/2015 Elsevier Interactive Patient Education  2019 [place] Maintenance for Postmenopausal Women Menopause is a normal process in which your reproductive ability comes to an end. This process happens gradually over a span of months to years, usually between the ages of 76 and 29. Menopause is complete when you have missed 12 consecutive menstrual periods. It is important to talk with your health care provider about some of the most common conditions that affect postmenopausal women, such as heart disease, cancer, and bone loss (osteoporosis). Adopting a healthy lifestyle and getting preventive care can help to promote your health and wellness. Those actions can also lower your chances of developing some of these common conditions. What should I know about menopause? During menopause, you may experience a number of symptoms, such as:  Moderate-to-severe hot flashes.  Night sweats.  Decrease in sex drive.  Mood swings.  Headaches.  Tiredness.  Irritability.  Memory problems.  Insomnia. Choosing to treat or not to treat menopausal changes is an individual decision that you make with your health care provider. What should I know about hormone replacement therapy and supplements? Hormone therapy products are effective for treating symptoms  that are associated with menopause, such as hot flashes and night sweats. Hormone replacement carries certain risks, especially as you become older. If you are thinking about using estrogen or estrogen with progestin treatments, discuss the benefits and risks with your health care provider. What should I know about heart disease and stroke? Heart disease, heart attack, and stroke become more likely as you age. This may be due, in part, to the hormonal changes that your body experiences during menopause. These can affect how your body processes dietary fats, triglycerides, and cholesterol. Heart attack and stroke are both medical emergencies. There are many things that you can do to help prevent heart disease and stroke:  Have your blood pressure checked at least every 1-2 years. High blood pressure causes heart disease and increases the risk of stroke.  If you are 29-59 years old, ask your health care provider if you should take aspirin to prevent a heart  attack or a stroke.  Do not use any tobacco products, including cigarettes, chewing tobacco, or electronic cigarettes. If you need help quitting, ask your health care provider.  It is important to eat a healthy diet and maintain a healthy weight. ? Be sure to include plenty of vegetables, fruits, low-fat dairy products, and lean protein. ? Avoid eating foods that are high in solid fats, added sugars, or salt (sodium).  Get regular exercise. This is one of the most important things that you can do for your health. ? Try to exercise for at least 150 minutes each week. The type of exercise that you do should increase your heart rate and make you sweat. This is known as moderate-intensity exercise. ? Try to do strengthening exercises at least twice each week. Do these in addition to the moderate-intensity exercise.  Know your numbers.Ask your health care provider to check your cholesterol and your blood glucose. Continue to have your blood tested as  directed by your health care provider.  What should I know about cancer screening? There are several types of cancer. Take the following steps to reduce your risk and to catch any cancer development as early as possible. Breast Cancer  Practice breast self-awareness. ? This means understanding how your breasts normally appear and feel. ? It also means doing regular breast self-exams. Let your health care provider know about any changes, no matter how small.  If you are 23 or older, have a clinician do a breast exam (clinical breast exam or CBE) every year. Depending on your age, family history, and medical history, it may be recommended that you also have a yearly breast X-ray (mammogram).  If you have a family history of breast cancer, talk with your health care provider about genetic screening.  If you are at high risk for breast cancer, talk with your health care provider about having an MRI and a mammogram every year.  Breast cancer (BRCA) gene test is recommended for women who have family members with BRCA-related cancers. Results of the assessment will determine the need for genetic counseling and BRCA1 and for BRCA2 testing. BRCA-related cancers include these types: ? Breast. This occurs in males or females. ? Ovarian. ? Tubal. This may also be called fallopian tube cancer. ? Cancer of the abdominal or pelvic lining (peritoneal cancer). ? Prostate. ? Pancreatic. Cervical, Uterine, and Ovarian Cancer Your health care provider may recommend that you be screened regularly for cancer of the pelvic organs. These include your ovaries, uterus, and vagina. This screening involves a pelvic exam, which includes checking for microscopic changes to the surface of your cervix (Pap test).  For women ages 21-65, health care providers may recommend a pelvic exam and a Pap test every three years. For women ages 68-65, they may recommend the Pap test and pelvic exam, combined with testing for human  papilloma virus (HPV), every five years. Some types of HPV increase your risk of cervical cancer. Testing for HPV may also be done on women of any age who have unclear Pap test results.  Other health care providers may not recommend any screening for nonpregnant women who are considered low risk for pelvic cancer and have no symptoms. Ask your health care provider if a screening pelvic exam is right for you.  If you have had past treatment for cervical cancer or a condition that could lead to cancer, you need Pap tests and screening for cancer for at least 20 years after your treatment. If Pap  tests have been discontinued for you, your risk factors (such as having a new sexual partner) need to be reassessed to determine if you should start having screenings again. Some women have medical problems that increase the chance of getting cervical cancer. In these cases, your health care provider may recommend that you have screening and Pap tests more often.  If you have a family history of uterine cancer or ovarian cancer, talk with your health care provider about genetic screening.  If you have vaginal bleeding after reaching menopause, tell your health care provider.  There are currently no reliable tests available to screen for ovarian cancer. Lung Cancer Lung cancer screening is recommended for adults 46-58 years old who are at high risk for lung cancer because of a history of smoking. A yearly low-dose CT scan of the lungs is recommended if you:  Currently smoke.  Have a history of at least 30 pack-years of smoking and you currently smoke or have quit within the past 15 years. A pack-year is smoking an average of one pack of cigarettes per day for one year. Yearly screening should:  Continue until it has been 15 years since you quit.  Stop if you develop a health problem that would prevent you from having lung cancer treatment. Colorectal Cancer  This type of cancer can be detected and can  often be prevented.  Routine colorectal cancer screening usually begins at age 98 and continues through age 5.  If you have risk factors for colon cancer, your health care provider may recommend that you be screened at an earlier age.  If you have a family history of colorectal cancer, talk with your health care provider about genetic screening.  Your health care provider may also recommend using home test kits to check for hidden blood in your stool.  A small camera at the end of a tube can be used to examine your colon directly (sigmoidoscopy or colonoscopy). This is done to check for the earliest forms of colorectal cancer.  Direct examination of the colon should be repeated every 5-10 years until age 72. However, if early forms of precancerous polyps or small growths are found or if you have a family history or genetic risk for colorectal cancer, you may need to be screened more often. Skin Cancer  Check your skin from head to toe regularly.  Monitor any moles. Be sure to tell your health care provider: ? About any new moles or changes in moles, especially if there is a change in a mole's shape or color. ? If you have a mole that is larger than the size of a pencil eraser.  If any of your family members has a history of skin cancer, especially at a young age, talk with your health care provider about genetic screening.  Always use sunscreen. Apply sunscreen liberally and repeatedly throughout the day.  Whenever you are outside, protect yourself by wearing long sleeves, pants, a wide-brimmed hat, and sunglasses. What should I know about osteoporosis? Osteoporosis is a condition in which bone destruction happens more quickly than new bone creation. After menopause, you may be at an increased risk for osteoporosis. To help prevent osteoporosis or the bone fractures that can happen because of osteoporosis, the following is recommended:  If you are 34-102 years old, get at least 1,000 mg  of calcium and at least 600 mg of vitamin D per day.  If you are older than age 52 but younger than age 18, get at  least 1,200 mg of calcium and at least 600 mg of vitamin D per day.  If you are older than age 20, get at least 1,200 mg of calcium and at least 800 mg of vitamin D per day. Smoking and excessive alcohol intake increase the risk of osteoporosis. Eat foods that are rich in calcium and vitamin D, and do weight-bearing exercises several times each week as directed by your health care provider. What should I know about how menopause affects my mental health? Depression may occur at any age, but it is more common as you become older. Common symptoms of depression include:  Low or sad mood.  Changes in sleep patterns.  Changes in appetite or eating patterns.  Feeling an overall lack of motivation or enjoyment of activities that you previously enjoyed.  Frequent crying spells. Talk with your health care provider if you think that you are experiencing depression. What should I know about immunizations? It is important that you get and maintain your immunizations. These include:  Tetanus, diphtheria, and pertussis (Tdap) booster vaccine.  Influenza every year before the flu season begins.  Pneumonia vaccine.  Shingles vaccine. Your health care provider may also recommend other immunizations. This information is not intended to replace advice given to you by your health care provider. Make sure you discuss any questions you have with your health care provider. Document Released: 12/23/2005 Document Revised: 05/20/2016 Document Reviewed: 08/04/2015 Elsevier Interactive Patient Education  2019 [employer].

## 2019-01-09 LAB — COMPREHENSIVE METABOLIC PANEL
ALT: 13 IU/L (ref 0–32)
AST: 5 IU/L (ref 0–40)
Albumin/Globulin Ratio: 1.8 (ref 1.2–2.2)
Albumin: 4.4 g/dL (ref 3.8–4.9)
Alkaline Phosphatase: 101 IU/L (ref 39–117)
BUN/Creatinine Ratio: 16 (ref 9–23)
BUN: 12 mg/dL (ref 6–24)
Bilirubin Total: 0.3 mg/dL (ref 0.0–1.2)
CALCIUM: 9.4 mg/dL (ref 8.7–10.2)
CO2: 26 mmol/L (ref 20–29)
Chloride: 101 mmol/L (ref 96–106)
Creatinine, Ser: 0.76 mg/dL (ref 0.57–1.00)
GFR calc Af Amer: 105 mL/min/{1.73_m2} (ref >59)
GFR calc non Af Amer: 91 mL/min/{1.73_m2} (ref >59)
Globulin, Total: 2.5 g/dL (ref 1.5–4.5)
Glucose: 84 mg/dL (ref 65–99)
Potassium: 4.2 mmol/L (ref 3.5–5.2)
Sodium: 141 mmol/L (ref 134–144)
Total Protein: 6.9 g/dL (ref 6.0–8.5)

## 2019-01-09 LAB — CBC
Hematocrit: 35.3 % (ref 34.0–46.6)
Hemoglobin: 11.6 g/dL (ref 11.1–15.9)
MCH: 26.7 pg (ref 26.6–33.0)
MCHC: 32.9 g/dL (ref 31.5–35.7)
MCV: 81 fL (ref 79–97)
PLATELETS: 322 10*3/uL (ref 150–450)
RBC: 4.35 x10E6/uL (ref 3.77–5.28)
RDW: 13.9 % (ref 11.7–15.4)
WBC: 7.8 10*3/uL (ref 3.4–10.8)

## 2019-01-16 ENCOUNTER — Ambulatory Visit (INDEPENDENT_AMBULATORY_CARE_PROVIDER_SITE_OTHER): Payer: Self-pay

## 2019-01-16 ENCOUNTER — Encounter (INDEPENDENT_AMBULATORY_CARE_PROVIDER_SITE_OTHER): Payer: Self-pay | Admitting: Orthopaedic Surgery

## 2019-01-16 ENCOUNTER — Ambulatory Visit (INDEPENDENT_AMBULATORY_CARE_PROVIDER_SITE_OTHER): Payer: BC Managed Care – PPO | Admitting: Orthopaedic Surgery

## 2019-01-16 VITALS — BP 119/76 | HR 75 | Ht 67.0 in | Wt 183.0 lb

## 2019-01-16 DIAGNOSIS — M79641 Pain in right hand: Secondary | ICD-10-CM | POA: Diagnosis not present

## 2019-01-16 DIAGNOSIS — M79671 Pain in right foot: Secondary | ICD-10-CM | POA: Diagnosis not present

## 2019-01-16 MED ORDER — METHYLPREDNISOLONE ACETATE 40 MG/ML IJ SUSP
40.0000 mg | INTRAMUSCULAR | Status: AC | PRN
  Administered 2019-01-16: 40 mg

## 2019-01-16 MED ORDER — LIDOCAINE HCL 1 % IJ SOLN
1.0000 mL | INTRAMUSCULAR | Status: AC | PRN
  Administered 2019-01-16: 1 mL

## 2019-01-16 NOTE — Progress Notes (Signed)
Office Visit Note   Patient: Emma Hall           Date of Birth: 12/07/1965           MRN: [id number] Visit Date: 01/16/2019              Requested by: Rubie Maid, [address], [place] [id number] PCP: Patient, No Pcp Per   Assessment & Plan: Visit Diagnoses:  1. Pain in right foot   2. Pain in right hand     Plan: Plantar fasciitis right foot.  Long discussion regarding diagnosis. will inject with cortisone.  Right long finger PIP joint recurrent swelling probably early arthritis.  No changes on x-ray.  Discussed use of anti-inflammatory medicines and heat.  No other joint pain.  No skin or nail abnormalities  Follow-Up Instructions: Return if symptoms worsen or fail to improve.   Orders:  Orders Placed This Encounter  Procedures  . Foot Inj  . XR Hand Complete Right  . XR Foot 2 Views Right   No orders of the defined types were placed in this encounter.     Procedures: Foot Inj Date/Time: 01/16/2019 10:09 AM Performed by: Garald Balding, MD Authorized by: Garald Balding, MD   Consent Given by:  Patient Timeout: prior to procedure the correct patient, procedure, and site was verified   Indications:  Fasciitis Condition: Plantar Fasciitis   Location: right plantar fascia muscle   Needle Size:  27 G Medications:  1 mL lidocaine 1 %; 40 mg methylPREDNISolone acetate 40 MG/ML Patient Tolerance:  Patient tolerated the procedure well with no immediate complications     Clinical Data: No additional findings.   Subjective: Chief Complaint  Patient presents with  . Right Foot - Pain  . Right Hand - Pain  Patient presents today with right foot pain. She has been having pain in the bottom of her heel for over 6 months. She went to a walk in clinic and was told it was plantar fascitis and has been doing exercises and wearing a foot splint to bed at night. She said that her foot hurts constantly, but is worse upon getting up after  resting.  Experiences pain when she first gets up in the morning or when she is sitting at a desk and gets up and applies weight pain is localized to the plantar aspect of the heel. She also presents with right third finger swelling at the PIP joint. She said that the swelling comes and goes. It is also painful. No known injury.   HPI  Review of Systems   Objective: Vital Signs: BP 119/76   Pulse 75   Ht 5\' 7"  (1.702 m)   Wt 183 lb (83 kg)   BMI 28.66 kg/m   Physical Exam Constitutional:      Appearance: She is well-developed.  Eyes:     Pupils: Pupils are equal, round, and reactive to light.  Pulmonary:     Effort: Pulmonary effort is normal.  Skin:    General: Skin is warm and dry.  Neurological:     Mental Status: She is alert and oriented to person, place, and time.  Psychiatric:        Behavior: Behavior normal.     Ortho Exam right hand without any obvious abnormalities about the PIP or DIP joints and specifically that of the long finger.  Full range of motion.  No edema.  Neurologically intact.  No thumb pain.  No discomfort over the median nerve  Right foot pain with some tenderness over the plantar fascial insertion of the os calcis.  No skin changes.  Good arch.  No posterior heel pain.  No dorsal foot pain.  No distal edema  Specialty Comments:  No specialty comments available.  Imaging: Xr Foot 2 Views Right  Result Date: 01/16/2019 Films of the right foot were obtained in several projections.  Patient has clinical evidence of plantar fasciitis.  There is a very small plantar spur.  No other abnormalities identified  Xr Hand Complete Right  Result Date: 01/16/2019 Films of the right hand were obtained in several projections.  Patient has had some recurrent swelling about the PIP joint of the long finger.  No identified abnormalities.    PMFS History: Patient Active Problem List   Diagnosis Date Noted  . Pain in right foot 01/16/2019  . Pain in right  hand 01/16/2019  . Overweight (BMI 25.0-29.9) 12/14/2017  . Post-menopause on HRT (hormone replacement therapy) 12/14/2017   Past Medical History:  Diagnosis Date  . Anemia   . Endometriosis   . Fibroid     Family History  Problem Relation Age of Onset  . Healthy Mother   . Healthy Father     Past Surgical History:  Procedure Laterality Date  . ABDOMINAL HYSTERECTOMY    . GALLBLADDER SURGERY     Social History   Occupational History  . Not on file  Tobacco Use  . Smoking status: Never Smoker  . Smokeless tobacco: Never Used  Substance and Sexual Activity  . Alcohol use: Yes    Frequency: Never    Comment: occass  . Drug use: No  . Sexual activity: Never    Birth control/protection: Surgical

## 2019-02-13 ENCOUNTER — Telehealth: Payer: Self-pay | Admitting: Gastroenterology

## 2019-02-13 ENCOUNTER — Telehealth: Payer: Self-pay

## 2019-02-13 NOTE — Telephone Encounter (Signed)
Pt left vm she is returning a call °

## 2019-02-13 NOTE — Telephone Encounter (Signed)
(   Pt to Call Back) Returned patients call.  LVM for her to call back to schedule her colonoscopy.  Thanks [employer]

## 2019-02-25 ENCOUNTER — Encounter: Payer: Self-pay | Admitting: *Deleted

## 2019-05-31 ENCOUNTER — Other Ambulatory Visit: Payer: Self-pay | Admitting: Obstetrics and Gynecology

## 2019-07-23 ENCOUNTER — Inpatient Hospital Stay: Admission: RE | Admit: 2019-07-23 | Payer: Self-pay | Source: Ambulatory Visit

## 2019-07-26 ENCOUNTER — Encounter: Payer: Self-pay | Admitting: Obstetrics and Gynecology

## 2019-08-06 ENCOUNTER — Ambulatory Visit
Admission: RE | Admit: 2019-08-06 | Discharge: 2019-08-06 | Disposition: A | Discharge status: Home / Self Care | Payer: BC Managed Care – PPO | Source: Ambulatory Visit | Attending: Obstetrics and Gynecology | Admitting: Obstetrics and Gynecology

## 2019-08-06 DIAGNOSIS — Z1231 Encounter for screening mammogram for malignant neoplasm of breast: Secondary | ICD-10-CM | POA: Diagnosis present

## 2019-08-06 DIAGNOSIS — Z01419 Encounter for gynecological examination (general) (routine) without abnormal findings: Secondary | ICD-10-CM | POA: Insufficient documentation

## 2019-08-09 ENCOUNTER — Ambulatory Visit (INDEPENDENT_AMBULATORY_CARE_PROVIDER_SITE_OTHER): Payer: BC Managed Care – PPO | Admitting: Obstetrics and Gynecology

## 2019-08-09 ENCOUNTER — Other Ambulatory Visit: Payer: Self-pay

## 2019-08-09 DIAGNOSIS — Z23 Encounter for immunization: Secondary | ICD-10-CM

## 2019-08-09 NOTE — Progress Notes (Signed)
Patient comes in today for Flu shot. Given in left deltoid. Patient tolerated well.

## 2019-08-11 ENCOUNTER — Encounter: Payer: Self-pay | Admitting: Obstetrics and Gynecology

## 2019-09-05 ENCOUNTER — Ambulatory Visit: Payer: BC Managed Care – PPO | Admitting: Orthopaedic Surgery

## 2019-09-05 ENCOUNTER — Ambulatory Visit: Payer: Self-pay

## 2019-09-05 ENCOUNTER — Encounter: Payer: Self-pay | Admitting: Orthopaedic Surgery

## 2019-09-05 ENCOUNTER — Other Ambulatory Visit: Payer: Self-pay

## 2019-09-05 VITALS — BP 125/85 | HR 93 | Ht 67.0 in | Wt 181.0 lb

## 2019-09-05 DIAGNOSIS — M25551 Pain in right hip: Secondary | ICD-10-CM | POA: Diagnosis not present

## 2019-09-05 DIAGNOSIS — M5137 Other intervertebral disc degeneration, lumbosacral region: Secondary | ICD-10-CM | POA: Diagnosis not present

## 2019-09-05 DIAGNOSIS — M7121 Synovial cyst of popliteal space [Baker], right knee: Secondary | ICD-10-CM | POA: Diagnosis not present

## 2019-09-05 DIAGNOSIS — G8929 Other chronic pain: Secondary | ICD-10-CM

## 2019-09-05 DIAGNOSIS — M545 Low back pain, unspecified: Secondary | ICD-10-CM | POA: Insufficient documentation

## 2019-09-05 DIAGNOSIS — M25561 Pain in right knee: Secondary | ICD-10-CM

## 2019-09-05 DIAGNOSIS — M7122 Synovial cyst of popliteal space [Baker], left knee: Secondary | ICD-10-CM | POA: Insufficient documentation

## 2019-09-05 DIAGNOSIS — M8700 Idiopathic aseptic necrosis of unspecified bone: Secondary | ICD-10-CM | POA: Insufficient documentation

## 2019-09-05 MED ORDER — METHYLPREDNISOLONE ACETATE 40 MG/ML IJ SUSP
80.0000 mg | INTRAMUSCULAR | Status: AC | PRN
  Administered 2019-09-05: 80 mg via INTRA_ARTICULAR

## 2019-09-05 MED ORDER — LIDOCAINE HCL 1 % IJ SOLN
2.0000 mL | INTRAMUSCULAR | Status: AC | PRN
  Administered 2019-09-05: 2 mL

## 2019-09-05 MED ORDER — BUPIVACAINE HCL 0.25 % IJ SOLN
2.0000 mL | INTRAMUSCULAR | Status: AC | PRN
  Administered 2019-09-05: 2 mL via INTRA_ARTICULAR

## 2019-09-05 NOTE — Progress Notes (Signed)
Office Visit Note   Patient: Emma Hall           Date of Birth: 1966/03/15           MRN: [id number] Visit Date: 09/05/2019              Requested by: Rubie Maid, [address] [place],  [place] [address] PCP: Rubie Maid, MD   Assessment & Plan: Visit Diagnoses:  1. Chronic pain of right knee   2. Midline low back pain, unspecified chronicity, unspecified whether sciatica present   3. DDD (degenerative disc disease), lumbosacral   4. AVN (avascular necrosis of bone) ([place])   5. Baker cyst, right     Plan:  #1: Corticosteroid injection was delivered to the right knee without difficulty. #2: We had a long discussion of her other problems including the low back pain with the degenerative disc disease at L1 5 S1.  Also that she appears to have early AVN of the right hip.   Follow-Up Instructions: Return if symptoms worsen or fail to improve.   Orders:  Orders Placed This Encounter  Procedures  . Large Joint Inj: L knee  . XR KNEE 3 VIEW RIGHT  . XR Lumbar Spine 2-3 Views  . XR HIP UNILAT W OR W/O PELVIS 2-3 VIEWS RIGHT   No orders of the defined types were placed in this encounter.     Procedures: Large Joint Inj: R knee on 09/05/2019 3:29 PM Indications: pain and diagnostic evaluation Details: 25 G 1.5 in needle, anteromedial approach  Arthrogram: No  Medications: 2 mL lidocaine 1 %; 80 mg methylPREDNISolone acetate 40 MG/ML; 2 mL bupivacaine 0.25 % Procedure, treatment alternatives, risks and benefits explained, specific risks discussed. Consent was given by the patient. Immediately prior to procedure a time out was called to verify the correct patient, procedure, equipment, support staff and site/side marked as required. Patient was prepped and draped in the usual sterile fashion.       Clinical Data: No additional findings.   Subjective: Chief Complaint  Patient presents with  . Right Knee - Pain   HPI Patient presents today  for right knee pain X 25month. No known injury. Her pain is located on both sides and posteriorly. Her pain is worse posteriorly. The pain is constant, but worse upon getting up or sitting down. She said that the lateral and medial sides burn. She went to [facility] clinic two weeks ago and had x-rays taken. She was told that she had a Bakers Cyst. She was given prednisone to take, but was unable to take it because it made her feel "crazy". She brought a copy of her x-rays on a disc to today's appointment but did not have anything on the discs.  She is also complaining of low back pain.  She does not remember any history of injury or trauma.  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  She does have some pain into her legs.   Review of Systems  Constitutional: Positive for fatigue.  HENT: Negative for ear pain.   Eyes: Negative for pain.  Respiratory: Negative for shortness of breath.   Cardiovascular: Negative for leg swelling.  Gastrointestinal: Negative for constipation and diarrhea.  Endocrine: Negative for cold intolerance and heat intolerance.  Genitourinary: Negative for difficulty urinating.  Musculoskeletal: Positive for joint swelling.  Skin: Negative for rash.  Allergic/Immunologic: Negative  for food allergies.  Neurological: Negative for weakness.  Hematological: Does not bruise/bleed easily.  Psychiatric/Behavioral: Positive for sleep disturbance.     Objective: Vital Signs: BP 125/85   Pulse 93   Ht 5\' 7"  (1.702 m)   Wt 181 lb (82.1 kg)   BMI 28.35 kg/m   Physical Exam Constitutional:      Appearance: Normal appearance. She is well-developed.  HENT:     Head: Normocephalic.  Eyes:     Pupils: Pupils are equal, round, and reactive to light.  Pulmonary:     Effort: Pulmonary effort is normal.  Skin:    General: Skin is warm and dry.  Neurological:     Mental Status: She is alert and oriented to  person, place, and time.  Psychiatric:        Behavior: Behavior normal.     Ortho Exam  Exam today reveals the right knee has some patellofemoral crepitance with motion.  She has near full extension flexes to about 100 degrees.  She does have a palpable Baker's cyst posteriorly low but more on her right than on her left knee.  She seems to have a mild effusion on the right without warmth or erythema.  Minimal pain with McMurray's.    She has the pain in the lumbar spine especially around the L5 and sacral area.  She has difficulty with forward flexion and she can get to about 4 inches off the floor with her fingertips.  Backward extension is also limited and is quite painful.  Right and left rotations are decreased she holds the spine firm.  With a twisting she is able to get about 20 degrees of right and left rotation.  She has negative straight leg raising bilaterally.  Deep tendon reflexes were 2-3+ bilateral symmetric in lower extremities.  Sensation is intact to light touch.  Calves are supple and nontender.  Specialty Comments:  No specialty comments available.  Imaging: Xr Knee 3 View Right  Result Date: 09/05/2019 Three-view x-ray of the right knee reveals peaked spines.  Some mild narrowing of the medial compartment.  Does appear to have some fluid in the knee.  Do not see any calcification.  Patella rides laterally just minimally.  Xr Lumbar Spine 2-3 Views  Result Date: 09/05/2019 2 view x-ray of the lumbar spine does reveal marked narrowing at L5-S1.  He also has sclerotic changes in the facet posteriorly at L5.  Do not see listhesis at this time.  Some posterior spurring off of L3.  No significant gas pattern.  Does have a significant amount of bowel gas and stool diffusely.    PMFS History: Current Outpatient Medications  Medication Sig Dispense Refill  . ferrous sulfate 325 (65 FE) MG EC tablet Take 325 mg by mouth 3 (three) times daily with meals.    Marland Kitchen LINZESS 145 MCG  CAPS capsule TAKE 1 CAPSULE(145 MCG) BY MOUTH DAILY BEFORE BREAKFAST 30 capsule 11   No current facility-administered medications for this visit.     Patient Active Problem List   Diagnosis Date Noted  . Low back pain 09/05/2019  . DDD (degenerative disc disease), lumbosacral 09/05/2019  . AVN (avascular necrosis of bone) ([place]) 09/05/2019  . Baker cyst, right 09/05/2019  . Pain in right foot 01/16/2019  . Pain in right hand 01/16/2019  . Overweight (BMI 25.0-29.9) 12/14/2017  . Post-menopause on HRT (hormone replacement therapy) 12/14/2017   Past Medical History:  Diagnosis Date  . Anemia   . Endometriosis   .  Fibroid     Family History  Problem Relation Age of Onset  . Healthy Mother   . Healthy Father   . Breast cancer Maternal Aunt 48  . Breast cancer Maternal Aunt 71  . Breast cancer Maternal Aunt 5    Past Surgical History:  Procedure Laterality Date  . ABDOMINAL HYSTERECTOMY    . GALLBLADDER SURGERY     Social History   Occupational History  . Not on file  Tobacco Use  . Smoking status: Never Smoker  . Smokeless tobacco: Never Used  Substance and Sexual Activity  . Alcohol use: Yes    Frequency: Never    Comment: occass  . Drug use: No  . Sexual activity: Yes    Birth control/protection: Surgical

## 2019-11-25 ENCOUNTER — Ambulatory Visit: Payer: BC Managed Care – PPO | Attending: Internal Medicine

## 2019-11-25 DIAGNOSIS — Z20822 Contact with and (suspected) exposure to covid-19: Secondary | ICD-10-CM

## 2019-11-26 LAB — NOVEL CORONAVIRUS, NAA: SARS-CoV-2, NAA: NOT DETECTED

## 2020-01-09 ENCOUNTER — Ambulatory Visit: Payer: BC Managed Care – PPO | Attending: Internal Medicine

## 2020-01-09 DIAGNOSIS — Z23 Encounter for immunization: Secondary | ICD-10-CM | POA: Insufficient documentation

## 2020-01-09 NOTE — Progress Notes (Signed)
   Covid-19 Vaccination Clinic  Name:  Emma Hall    MRN: [id number] DOB: 1965-11-22  01/09/2020  Emma Hall was observed post Covid-19 immunization for 15 minutes without incidence. She was provided with Vaccine Information Sheet and instruction to access the V-Safe system.   Emma Hall was instructed to call 911 with any severe reactions post vaccine: Marland Kitchen Difficulty breathing  . Swelling of your face and throat  . A fast heartbeat  . A bad rash all over your body  . Dizziness and weakness    Immunizations Administered    Name Date Dose VIS Date Route   Pfizer COVID-19 Vaccine 01/09/2020  9:04 AM 0.3 mL 10/25/2019 Intramuscular   Manufacturer: [employer]   Lot: [id number]   [place]: [id number]

## 2020-01-10 ENCOUNTER — Encounter: Payer: BC Managed Care – PPO | Admitting: Obstetrics and Gynecology

## 2020-01-29 ENCOUNTER — Other Ambulatory Visit: Payer: Self-pay

## 2020-01-29 ENCOUNTER — Ambulatory Visit: Payer: BC Managed Care – PPO | Attending: Internal Medicine

## 2020-01-29 ENCOUNTER — Ambulatory Visit: Payer: BC Managed Care – PPO | Admitting: Orthopaedic Surgery

## 2020-01-29 ENCOUNTER — Encounter: Payer: Self-pay | Admitting: Orthopaedic Surgery

## 2020-01-29 DIAGNOSIS — Z23 Encounter for immunization: Secondary | ICD-10-CM

## 2020-01-29 DIAGNOSIS — M1611 Unilateral primary osteoarthritis, right hip: Secondary | ICD-10-CM

## 2020-01-29 DIAGNOSIS — M1711 Unilateral primary osteoarthritis, right knee: Secondary | ICD-10-CM

## 2020-01-29 MED ORDER — LIDOCAINE HCL 1 % IJ SOLN
2.0000 mL | INTRAMUSCULAR | Status: AC | PRN
  Administered 2020-01-29: 2 mL

## 2020-01-29 MED ORDER — BUPIVACAINE HCL 0.5 % IJ SOLN
2.0000 mL | INTRAMUSCULAR | Status: AC | PRN
  Administered 2020-01-29: 2 mL via INTRA_ARTICULAR

## 2020-01-29 MED ORDER — METHYLPREDNISOLONE ACETATE 40 MG/ML IJ SUSP
80.0000 mg | INTRAMUSCULAR | Status: AC | PRN
  Administered 2020-01-29: 80 mg via INTRA_ARTICULAR

## 2020-01-29 NOTE — Progress Notes (Signed)
Office Visit Note   Patient: Emma Hall           Date of Birth: 01/01/1966           MRN: [id number] Visit Date: 01/29/2020              Requested by: Rubie Maid, MD [address] [address] [place],  [place] [address] PCP: Caren Macadam, MD   Assessment & Plan: Visit Diagnoses:  1. Unilateral primary osteoarthritis, right knee   2. Unilateral primary osteoarthritis, right hip     Plan: Current symptoms of osteoarthritis right knee.  Prior films demonstrate mild to moderate degenerative changes.  Will inject with cortisone as she has had good response in the past.  Also had a long discussion regarding the issue with her right hip.  There are some definite degenerative changes with subchondral cyst in the superior aspect of the femoral head and possibly some early collapse consistent with avascular necrosis.  I discussed cortisone injection even hip replacement.  She does have some compromise of her activities but not enough to consider surgery at this point.  I think her knee pain is separate from her hip  Follow-Up Instructions: Return if symptoms worsen or fail to improve.   Orders:  Orders Placed This Encounter  Procedures  . Large Joint Inj: R knee   No orders of the defined types were placed in this encounter.     Procedures: Large Joint Inj: R knee on 01/29/2020 11:45 AM Indications: pain and diagnostic evaluation Details: 25 G 1.5 in needle, anteromedial approach  Arthrogram: No  Medications: 2 mL lidocaine 1 %; 2 mL bupivacaine 0.5 %; 80 mg methylPREDNISolone acetate 40 MG/ML Procedure, treatment alternatives, risks and benefits explained, specific risks discussed. Consent was given by the patient. Immediately prior to procedure a time out was called to verify the correct patient, procedure, equipment, support staff and site/side marked as required. Patient was prepped and draped in the usual sterile fashion.       Clinical Data: No additional  findings.   Subjective: Chief Complaint  Patient presents with  . Right Knee - Pain  . Right Hip - Pain  Patient presents today for recurrent right hip and knee pain. She was last evaluated in October of 2020 and received a cortisone injection in her right knee. She said that the injection was helpful, but did not seem to last as long as she thought it would. It has progressively worsened, but got really bad last week. She is not taking anything for pain. She still has pain in her groin and states that sometimes it will catch when walking. The pain is not constant. No pain radiates down her leg.   HPI  Review of Systems   Objective: Vital Signs: Ht 5\' 7"  (1.702 m)   Wt 183 lb (83 kg)   BMI 28.66 kg/m   Physical Exam Constitutional:      Appearance: She is well-developed.  Eyes:     Pupils: Pupils are equal, round, and reactive to light.  Pulmonary:     Effort: Pulmonary effort is normal.  Skin:    General: Skin is warm and dry.  Neurological:     Mental Status: She is alert and oriented to person, place, and time.  Psychiatric:        Behavior: Behavior normal.     Ortho Exam right knee with very minimal effusion.  The knee was not hot warm or red.  No instability.  Predominately medial joint pain.  Mild patellar crepitation.  No pain laterally.  Flexed over 100 degrees without any opening with varus valgus stress.  No popliteal mass or pain.  No calf pain.  Very mild pain with internal/external rotation of right hip on extremes.  Straight leg raise negative  Specialty Comments:  No specialty comments available.  Imaging: No results found.   PMFS History: Patient Active Problem List   Diagnosis Date Noted  . Unilateral primary osteoarthritis, right knee 01/29/2020  . Unilateral primary osteoarthritis, right hip 01/29/2020  . Low back pain 09/05/2019  . DDD (degenerative disc disease), lumbosacral 09/05/2019  . AVN (avascular necrosis of bone) ([place]) 09/05/2019  .  Baker cyst, right 09/05/2019  . Pain in right foot 01/16/2019  . Pain in right hand 01/16/2019  . Overweight (BMI 25.0-29.9) 12/14/2017  . Post-menopause on HRT (hormone replacement therapy) 12/14/2017   Past Medical History:  Diagnosis Date  . Anemia   . Endometriosis   . Fibroid     Family History  Problem Relation Age of Onset  . Healthy Mother   . Healthy Father   . Breast cancer Maternal Aunt 48  . Breast cancer Maternal Aunt 17  . Breast cancer Maternal Aunt 88    Past Surgical History:  Procedure Laterality Date  . ABDOMINAL HYSTERECTOMY    . GALLBLADDER SURGERY     Social History   Occupational History  . Not on file  Tobacco Use  . Smoking status: Never Smoker  . Smokeless tobacco: Never Used  Substance and Sexual Activity  . Alcohol use: Yes    Comment: occass  . Drug use: No  . Sexual activity: Yes    Birth control/protection: Surgical

## 2020-01-29 NOTE — Progress Notes (Signed)
   Covid-19 Vaccination Clinic  Name:  Emma Hall    MRN: [id number] DOB: June 17, 1966  01/29/2020  [name] was observed post Covid-19 immunization for 15 minutes without incident. She was provided with Vaccine Information Sheet and instruction to access the V-Safe system.   [name] was instructed to call 911 with any severe reactions post vaccine: Marland Kitchen Difficulty breathing  . Swelling of face and throat  . A fast heartbeat  . A bad rash all over body  . Dizziness and weakness   Immunizations Administered    Name Date Dose VIS Date Route   Pfizer COVID-19 Vaccine 01/29/2020  2:49 PM 0.3 mL 10/25/2019 Intramuscular   Manufacturer: [employer]   Lot: [id number]   [place]: [id number]

## 2020-01-30 ENCOUNTER — Encounter: Payer: Self-pay | Admitting: Cardiology

## 2020-01-30 ENCOUNTER — Ambulatory Visit: Payer: BC Managed Care – PPO | Admitting: Cardiology

## 2020-01-30 VITALS — BP 137/81 | HR 76 | Temp 97.9°F | Ht 67.0 in | Wt 184.4 lb

## 2020-01-30 DIAGNOSIS — R0683 Snoring: Secondary | ICD-10-CM | POA: Insufficient documentation

## 2020-01-30 DIAGNOSIS — R002 Palpitations: Secondary | ICD-10-CM | POA: Insufficient documentation

## 2020-01-30 DIAGNOSIS — I499 Cardiac arrhythmia, unspecified: Secondary | ICD-10-CM | POA: Insufficient documentation

## 2020-01-30 NOTE — Progress Notes (Signed)
Patient referred by Caren Macadam, MD for tachycardia/palpitations  Subjective:   Emma Hall, female    DOB: Nov 19, 1965, 54 y.o.   MRN: [id number]   Chief Complaint  Patient presents with  . Tachycardia  . Palpitations  . New Patient (Initial Visit)     HPI  54 y.o. 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 She has had two distinct episodes of HR up to 130s (as note don her smarth watch) occur at rest- on 01/16/2020 and in September 2019. She denies chest pain, shortness of breath, leg edema, orthopnea, PND, TIA/syncope. She endorses snoring at night, with daytime sleepiness and fatigue. She drinks coffee or alcohol only occasionally.    Past Medical History:  Diagnosis Date  . Anemia   . Endometriosis   . Fibroid      Past Surgical History:  Procedure Laterality Date  . ABDOMINAL HYSTERECTOMY    . GALLBLADDER SURGERY       Social History   Tobacco Use  Smoking Status Never Smoker  Smokeless Tobacco Never Used    Social History   Substance and Sexual Activity  Alcohol Use Yes   Comment: occass     Family History  Problem Relation Age of Onset  . Healthy Mother   . Healthy Father   . Breast cancer Maternal Aunt 48  . Breast cancer Maternal Aunt 52  . Breast cancer Maternal Aunt 50     Current Outpatient Medications on File Prior to Visit  Medication Sig Dispense Refill  . escitalopram (LEXAPRO) 5 MG tablet Take 5 mg by mouth daily.    . ferrous sulfate 325 (65 FE) MG EC tablet Take 325 mg by mouth 3 (three) times daily with meals.    Marland Kitchen LINZESS 145 MCG CAPS capsule TAKE 1 CAPSULE(145 MCG) BY MOUTH DAILY BEFORE BREAKFAST 30 capsule 11   No current facility-administered medications on file prior to visit.    Cardiovascular and other pertinent studies:  EKG 01/30/2020: Sinus rhythm 71 bpm.  Normal EKG.   Recent labs: 01/22/2020: Glucose 88, BUN/Cr 12/0.86. EGFR 69. Na/K 143/3.6. Rest of the CMP normal H/H  11.8/35.6. MCV 83. Platelets 331 Chol 186, TG 102, HDL 50, LDL 117 TSH 1.0normal    Review of Systems  Cardiovascular: Positive for palpitations. Negative for chest pain, dyspnea on exertion, leg swelling and syncope.         Vitals:   01/30/20 1507  BP: 137/81  Pulse: 76  Temp: 97.9 F (36.6 C)  SpO2: 99%     Body mass index is 28.88 kg/m. Filed Weights   01/30/20 1507  Weight: 184 lb 6.4 oz (83.6 kg)     Objective:   Physical Exam  Constitutional: She appears well-developed and well-nourished.  Neck: No JVD present.  Cardiovascular: Normal rate, regular rhythm, normal heart sounds and intact distal pulses.  No murmur heard. Pulmonary/Chest: Effort normal and breath sounds normal. She has no wheezes. She has no rales.  Musculoskeletal:        General: No edema.  Nursing note and vitals reviewed.       Assessment & Recommendations:   54 y.o. African American female with palpitations, irregular heart beat  Atria fibrillation is in the differential. Will obtain echocardiogram and event monitoring. Given suspicion for OSA, recommend sleep study.  Further recommendations after above testing.   Thank you for referring the patient to [place]. Please feel free to contact with any questions.  [place],  MD [facility] Pager: [phone] Office: [phone]

## 2020-02-05 ENCOUNTER — Encounter: Payer: Self-pay | Admitting: Obstetrics and Gynecology

## 2020-02-05 ENCOUNTER — Ambulatory Visit (INDEPENDENT_AMBULATORY_CARE_PROVIDER_SITE_OTHER): Payer: BC Managed Care – PPO | Admitting: Obstetrics and Gynecology

## 2020-02-05 ENCOUNTER — Other Ambulatory Visit: Payer: Self-pay

## 2020-02-05 VITALS — BP 115/75 | HR 101 | Ht 67.0 in | Wt 181.7 lb

## 2020-02-05 DIAGNOSIS — N3281 Overactive bladder: Secondary | ICD-10-CM

## 2020-02-05 DIAGNOSIS — Z1231 Encounter for screening mammogram for malignant neoplasm of breast: Secondary | ICD-10-CM | POA: Diagnosis not present

## 2020-02-05 DIAGNOSIS — Z780 Asymptomatic menopausal state: Secondary | ICD-10-CM

## 2020-02-05 DIAGNOSIS — Z01419 Encounter for gynecological examination (general) (routine) without abnormal findings: Secondary | ICD-10-CM | POA: Diagnosis not present

## 2020-02-05 DIAGNOSIS — E663 Overweight: Secondary | ICD-10-CM

## 2020-02-05 DIAGNOSIS — Z1211 Encounter for screening for malignant neoplasm of colon: Secondary | ICD-10-CM

## 2020-02-05 NOTE — Progress Notes (Signed)
Pt present for annual exam. Pt had flu vaccine 08/09/19. Pt c/o not able to hold her bladder. Pt stated that if she do not go when she feels the urge she may wet her pants. Pt stated that she is highly concerned about her bladder. cologuard form signed by pt today for colonoscopy.

## 2020-02-05 NOTE — Progress Notes (Signed)
ANNUAL PREVENTATIVE CARE GYNECOLOGY  ENCOUNTER NOTE  Subjective:       Emma Hall is a 54 y.o. G2P2 female here to establish care, and for a routine annual gynecologic exam. The patient is sexually active. The patient is not curently taking hormone replacement therapy (did take OTC Estroven daily, x 2 years). Patient denies post-menopausal vaginal bleeding. The patient wears seatbelts: yes. The patient participates in regular exercise: yes, occasionally walking. Has the patient ever been transfused or tattooed?: no.    Current complaints: 1.  Patient complains of urinary urgency for the past 6 months.  Notes that she is unable to hold her bladder like she used to.  Has not had any episodes of incontinence, but when she feels the urge to go it is sudden, and she has limited time to make it to the restroom.  Denies dysuria, hematuria, h/o bladder or kidney infections or stones. 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     Gynecologic History No LMP recorded. Patient has had a hysterectomy. Contraception: status post hysterectomy Last Pap: 2015. Results were: normal. No longer needed.  Last mammogram: 07/2019. Results were: normal Last Colonoscopy: ~ 7years ago. Results were: normal per patient.    Obstetric History OB History  Gravida Para Term Preterm AB Living  2 2          SAB TAB Ectopic Multiple Live Births               # Outcome Date GA Lbr Len/2nd Weight Sex Delivery Anes PTL Lv  2 Para 1991    F CS-LTranv     1 Para 1989    M CS-LTranv       Past Medical History:  Diagnosis Date  . Anemia   . Arthritis    right knee right hip lower back  . Endometriosis   . Fibroid   . Heart palpitations     Family History  Problem Relation Age of Onset  . Healthy Mother   . Healthy Father   . Breast cancer Maternal Aunt 48   . Breast cancer Maternal Aunt 60  . Breast cancer Maternal Aunt 60    Past Surgical History:  Procedure Laterality Date  . ABDOMINAL HYSTERECTOMY    . GALLBLADDER SURGERY      Social History   Socioeconomic History  . Marital status: Single    Spouse name: Not on file  . Number of children: 2  . Years of education: Not on file  . Highest education level: Not on file  Occupational History  . Not on file  Tobacco Use  . Smoking status: Never Smoker  . Smokeless tobacco: Never Used  Substance and Sexual Activity  . Alcohol use: Not Currently    Comment: occass  . Drug use: No  . Sexual activity: Not Currently    Birth control/protection: Surgical  Other Topics Concern  . Not on file  Social History Narrative  . Not on file   Social Determinants of Health   Financial Resource Strain:   . Difficulty of Paying Living Expenses:   Food Insecurity:   . Worried About Charity fundraiser in the Last Year:   . Arboriculturist in the Last Year:   Transportation Needs:   . Film/video editor (Medical):   Marland Kitchen Lack  of Transportation (Non-Medical):   Physical Activity:   . Days of Exercise per Week:   . Minutes of Exercise per Session:   Stress:   . Feeling of Stress :   Social Connections:   . Frequency of Communication with Friends and Family:   . Frequency of Social Gatherings with Friends and Family:   . Attends Religious Services:   . Active Member of Clubs or Organizations:   . Attends Archivist Meetings:   Marland Kitchen Marital Status:   Intimate Partner Violence:   . Fear of Current or Ex-Partner:   . Emotionally Abused:   Marland Kitchen Physically Abused:   . Sexually Abused:     Current Outpatient Medications on File Prior to Visit  Medication Sig Dispense Refill  . escitalopram (LEXAPRO) 5 MG tablet Take 5 mg by mouth daily.    . ferrous sulfate 325 (65 FE) MG EC tablet Take 325 mg by mouth 3 (three) times daily with meals.    Marland Kitchen LINZESS 145 MCG CAPS capsule TAKE 1  CAPSULE(145 MCG) BY MOUTH DAILY BEFORE BREAKFAST 30 capsule 11  . Multiple Vitamin (MULTI VITAMIN DAILY PO) Take by mouth.     No current facility-administered medications on file prior to visit.    No Known Allergies    Review of Systems ROS Review of Systems - General ROS: negative for - chills, fatigue, fever, hot flashes, night sweats, weight gain or weight loss Psychological ROS: negative for - anxiety, decreased libido, depression, mood swings, physical abuse or sexual abuse Ophthalmic ROS: negative for - blurry vision, eye pain or loss of vision ENT ROS: negative for - headaches, hearing change, visual changes or vocal changes Allergy and Immunology ROS: negative for - hives, itchy/watery eyes or seasonal allergies Hematological and Lymphatic ROS: negative for - bleeding problems, bruising, swollen lymph nodes or weight loss Endocrine ROS: negative for - galactorrhea, hair pattern changes, hot flashes, malaise/lethargy, mood swings, palpitations, polydipsia/polyuria, skin changes, temperature intolerance or unexpected weight changes Breast ROS: negative for - new or changing breast lumps or nipple discharge Respiratory ROS: negative for - cough or shortness of breath Cardiovascular ROS: negative for - chest pain, irregular heartbeat, palpitations or shortness of breath Gastrointestinal ROS: no abdominal pain, change in bowel habits, or black or bloody stools.  Genito-Urinary ROS: no dysuria, hematuria.  Positive for urinary urgency x 6 months.  Musculoskeletal ROS: negative for - joint pain or joint stiffness.  Neurological ROS: negative for - bowel and bladder control changes Dermatological ROS: negative for rash and skin lesion changes   Objective:   BP 115/75   Pulse (!) 101   Ht 5\' 7"  (1.702 m)   Wt 181 lb 11.2 oz (82.4 kg)   BMI 28.46 kg/m  CONSTITUTIONAL: Well-developed, well-nourished female in no acute distress. Overweight.  PSYCHIATRIC: Normal mood and affect.  Normal behavior. Normal judgment and thought content. Vina: Alert and oriented to person, place, and time. Normal muscle tone coordination. No cranial nerve deficit noted. HENT:  Normocephalic, atraumatic, External right and left ear normal. Oropharynx is clear and moist EYES: Conjunctivae and EOM are normal. Pupils are equal, round, and reactive to light. No scleral icterus.  NECK: Normal range of motion, supple, no masses.  Normal thyroid.  SKIN: Skin is warm and dry. No rash noted. Not diaphoretic. No erythema. No pallor. CARDIOVASCULAR: Normal heart rate noted, regular rhythm, no murmur. RESPIRATORY: Clear to auscultation bilaterally. Effort and breath sounds normal, no problems with respiration noted. BREASTS: Symmetric in  size. No masses, skin changes, nipple drainage, or lymphadenopathy. ABDOMEN: Soft, normal bowel sounds, no distention noted.  No tenderness, rebound or guarding.  BLADDER: Normal PELVIC:  Bladder no bladder distension noted  Urethra: normal appearing urethra with no masses, tenderness or lesions  Vulva: normal appearing vulva with no masses, tenderness or lesions  Vagina: normal appearing vagina with normal color and discharge, no lesions  Cervix: normal appearing cervix without discharge or lesions  Uterus: uterus is normal size, shape, consistency and nontender  Adnexa: normal adnexa in size, nontender and no masses  RV: External Exam NormaI, No Rectal Masses and Normal Sphincter tone  MUSCULOSKELETAL: Normal range of motion. No tenderness.  No cyanosis, clubbing, or edema.  2+ distal pulses. LYMPHATIC: No Axillary, Supraclavicular, or Inguinal Adenopathy.   Labs: Lab Results  Component Value Date   WBC 7.8 01/08/2019   HGB 11.6 01/08/2019   HCT 35.3 01/08/2019   MCV 81 01/08/2019   PLT 322 01/08/2019    Lab Results  Component Value Date   CREATININE 0.76 01/08/2019   BUN 12 01/08/2019   NA 141 01/08/2019   K 4.2 01/08/2019   CL 101 01/08/2019    CO2 26 01/08/2019    Lab Results  Component Value Date   ALT 13 01/08/2019   AST 5 01/08/2019   ALKPHOS 101 01/08/2019   BILITOT 0.3 01/08/2019    Lab Results  Component Value Date   CHOL 177 12/12/2017   HDL 57 12/12/2017   LDLCALC 103 (H) 12/12/2017   TRIG 83 12/12/2017   CHOLHDL 3.1 12/12/2017    Lab Results  Component Value Date   TSH 1.320 12/12/2017    No results found for: HGBA1C   Assessment:   Annual gynecologic examination 54 y.o. Contraception: post menopausal status Overweight  Menopausal  Overactive bladder  Plan:  - Pap: Not needed. Patient is s/p hysterectomy . - Mammogram: Ordered - Stool Guaiac Testing:  Ordered. Patient did not perform last year.  - Labs: None ordered.  Performed by PCP.   - Routine preventative health maintenance measures emphasized: Exercise/Diet/Weight control, Alcohol/Substance use risks and Stress Management - Menopausal, no longer having vasomotor symptoms, stopped OTC hormonal supplementation last year. Advised on Calcium and Vitamin D supplementation to decrease risk of osteoporosis.  - Discussed symptoms of urinary urgency, likely overactive bladder symptoms. Discussed adequate hydration, timed voiding, avoiding bladder irritants (given list). If symptoms do not improve, can consider medications.  To return in 4 weeks if symptoms have not improved.  - Flu vaccine up to date.  - Completed COVID vaccination series last week.  - Return to [place]

## 2020-02-05 NOTE — Patient Instructions (Addendum)
Preventive Care 40-53 Years Old, Female Preventive care refers to visits with your health care provider and lifestyle choices that can promote health and wellness. This includes:  A yearly physical exam. This may also be called an annual well check.  Regular dental visits and eye exams.  Immunizations.  Screening for certain conditions.  Healthy lifestyle choices, such as eating a healthy diet, getting regular exercise, not using drugs or products that contain nicotine and tobacco, and limiting alcohol use. What can I expect for my preventive care visit? Physical exam Your health care provider will check your:  Height and weight. This may be used to calculate body mass index (BMI), which tells if you are at a healthy weight.  Heart rate and blood pressure.  Skin for abnormal spots. Counseling Your health care provider may ask you questions about your:  Alcohol, tobacco, and drug use.  Emotional well-being.  Home and relationship well-being.  Sexual activity.  Eating habits.  Work and work environment.  Method of birth control.  Menstrual cycle.  Pregnancy history. What immunizations do I need?  Influenza (flu) vaccine  This is recommended every year. Tetanus, diphtheria, and pertussis (Tdap) vaccine  You may need a Td booster every 10 years. Varicella (chickenpox) vaccine  You may need this if you have not been vaccinated. Zoster (shingles) vaccine  You may need this after age 60. Measles, mumps, and rubella (MMR) vaccine  You may need at least one dose of MMR if you were born in 1957 or later. You may also need a second dose. Pneumococcal conjugate (PCV13) vaccine  You may need this if you have certain conditions and were not previously vaccinated. Pneumococcal polysaccharide (PPSV23) vaccine  You may need one or two doses if you smoke cigarettes or if you have certain conditions. Meningococcal conjugate (MenACWY) vaccine  You may need this if you  have certain conditions. Hepatitis A vaccine  You may need this if you have certain conditions or if you travel or work in places where you may be exposed to hepatitis A. Hepatitis B vaccine  You may need this if you have certain conditions or if you travel or work in places where you may be exposed to hepatitis B. Haemophilus influenzae type b (Hib) vaccine  You may need this if you have certain conditions. Human papillomavirus (HPV) vaccine  If recommended by your health care provider, you may need three doses over 6 months. You may receive vaccines as individual doses or as more than one vaccine together in one shot (combination vaccines). Talk with your health care provider about the risks and benefits of combination vaccines. What tests do I need? Blood tests  Lipid and cholesterol levels. These may be checked every 5 years, or more frequently if you are over 53 years old.  Hepatitis C test.  Hepatitis B test. Screening  Lung cancer screening. You may have this screening every year starting at age 53 if you have a 30-pack-year history of smoking and currently smoke or have quit within the past 15 years.  Colorectal cancer screening. All adults should have this screening starting at age 53 and continuing until age 53. Your health care provider may recommend screening at age 45 if you are at increased risk. You will have tests every 1-10 years, depending on your results and the type of screening test.  Diabetes screening. This is done by checking your blood sugar (glucose) after you have not eaten for a while (fasting). You may have this   done every 1-3 years.  Mammogram. This may be done every 1-2 years. Talk with your health care provider about when you should start having regular mammograms. This may depend on whether you have a family history of breast cancer.  BRCA-related cancer screening. This may be done if you have a family history of breast, ovarian, tubal, or peritoneal  cancers.  Pelvic exam and Pap test. This may be done every 3 years starting at age 53. Starting at age 53, this may be done every 5 years if you have a Pap test in combination with an HPV test. Other tests  Sexually transmitted disease (STD) testing.  Bone density scan. This is done to screen for osteoporosis. You may have this scan if you are at high risk for osteoporosis. Follow these instructions at home: Eating and drinking  Eat a diet that includes fresh fruits and vegetables, whole grains, lean protein, and low-fat dairy.  Take vitamin and mineral supplements as recommended by your health care provider.  Do not drink alcohol if: ? Your health care provider tells you not to drink. ? You are pregnant, may be pregnant, or are planning to become pregnant.  If you drink alcohol: ? Limit how much you have to 0-1 drink a day. ? Be aware of how much alcohol is in your drink. In the [place] one drink equals one 12 oz bottle of beer (355 mL), one 5 oz glass of wine (148 mL), or one 1 oz glass of hard liquor (44 mL). Lifestyle  Take daily care of your teeth and gums.  Stay active. Exercise for at least 30 minutes on 5 or more days each week.  Do not use any products that contain nicotine or tobacco, such as cigarettes, e-cigarettes, and chewing tobacco. If you need help quitting, ask your health care provider.  If you are sexually active, practice safe sex. Use a condom or other form of birth control (contraception) in order to prevent pregnancy and STIs (sexually transmitted infections).  If told by your health care provider, take low-dose aspirin daily starting at age 53. What's next?  Visit your health care provider once a year for a well check visit.  Ask your health care provider how often you should have your eyes and teeth checked.  Stay up to date on all vaccines. This information is not intended to replace advice given to you by your health care provider. Make sure you  discuss any questions you have with your health care provider. Document Revised: 07/12/2018 Document Reviewed: 07/12/2018 Elsevier Patient Education  2020 [employer] Breast self-awareness is knowing how your breasts look and feel. Doing breast self-awareness is important. It allows you to catch a breast problem early while it is still small and can be treated. All women should do breast self-awareness, including women who have had breast implants. Tell your doctor if you notice a change in your breasts. What you need:  A mirror.  A well-lit room. How to do a breast self-exam A breast self-exam is one way to learn what is normal for your breasts and to check for changes. To do a breast self-exam: Look for changes  1. Take off all the clothes above your waist. 2. Stand in front of a mirror in a room with good lighting. 3. Put your hands on your hips. 4. Push your hands down. 5. Look at your breasts and nipples in the mirror to see if one breast or nipple looks different  other. Check to see if: ? The shape of one breast is different. ? The size of one breast is different. ? There are wrinkles, dips, and bumps in one breast and not the other. 6. Look at each breast for changes in the skin, such as: ? Redness. ? Scaly areas. 7. Look for changes in your nipples, such as: ? Liquid around the nipples. ? Bleeding. ? Dimpling. ? Redness. ? A change in where the nipples are. Feel for changes  1. Lie on your back on the floor. 2. Feel each breast. To do this, follow these steps: ? Pick a breast to feel. ? Put the arm closest to that breast above your head. ? Use your other arm to feel the nipple area of your breast. Feel the area with the pads of your three middle fingers by making small circles with your fingers. For the first circle, press lightly. For the second circle, press harder. For the third circle, press even harder. ? Keep making circles with  your fingers at the different pressures as you move down your breast. Stop when you feel your ribs. ? Move your fingers a little toward the center of your body. ? Start making circles with your fingers again, this time going up until you reach your collarbone. ? Keep making up-and-down circles until you reach your armpit. Remember to keep using the three pressures. ? Feel the other breast in the same way. 3. Sit or stand in the tub or shower. 4. With soapy water on your skin, feel each breast the same way you did in step 2 when you were lying on the floor. Write down what you find Writing down what you find can help you remember what to tell your doctor. Write down:  What is normal for each breast.  Any changes you find in each breast, including: ? The kind of changes you find. ? Whether you have pain. ? Size and location of any lumps.  When you last had your menstrual period. General tips  Check your breasts every month.  If you are breastfeeding, the best time to check your breasts is after you feed your baby or after you use a breast pump.  If you get menstrual periods, the best time to check your breasts is 5-7 days after your menstrual period is over.  With time, you will become comfortable with the self-exam, and you will begin to know if there are changes in your breasts. Contact a doctor if you:  See a change in the shape or size of your breasts or nipples.  See a change in the skin of your breast or nipples, such as red or scaly skin.  Have fluid coming from your nipples that is not normal.  Find a lump or thick area that was not there before.  Have pain in your breasts.  Have any concerns about your breast health. Summary  Breast self-awareness includes looking for changes in your breasts, as well as feeling for changes within your breasts.  Breast self-awareness should be done in front of a mirror in a well-lit room.  You should check your breasts every month.  If you get menstrual periods, the best time to check your breasts is 5-7 days after your menstrual period is over.  Let your doctor know of any changes you see in your breasts, including changes in size, changes on the skin, pain or tenderness, or fluid from your nipples that is not normal. This information is not   is not intended to replace advice given to you by your health care provider. Make sure you discuss any questions you have with your health care provider. Document Revised: 06/19/2018 Document Reviewed: 06/19/2018 Elsevier Patient Education  [place].    Overactive Bladder, Adult  Overactive bladder refers to a condition in which a person has a sudden need to pass urine. The person may leak urine if he or she cannot get to the bathroom fast enough (urinary incontinence). A person with this condition may also wake up several times in the night to go to the bathroom. Overactive bladder is associated with poor nerve signals between your bladder and your brain. Your bladder may get the signal to empty before it is full. You may also have very sensitive muscles that make your bladder squeeze too soon. These symptoms might interfere with daily work or social activities. What are the causes? This condition may be associated with or caused by:  Urinary tract infection.  Infection of nearby tissues, such as the prostate.  Prostate enlargement.  Surgery on the uterus or urethra.  Bladder stones, inflammation, or tumors.  Drinking too much caffeine or alcohol.  Certain medicines, especially medicines that get rid of extra fluid in the body (diuretics).  Muscle or nerve weakness, especially from: ? A spinal cord injury. ? Stroke. ? Multiple sclerosis. ? Parkinson's disease.  Diabetes.  Constipation. What increases the risk? You may be at greater risk for overactive bladder if you:  Are an older adult.  Smoke.  Are going through menopause.  Have prostate  problems.  Have a neurological disease, such as stroke, dementia, Parkinson's disease, or multiple sclerosis (MS).  Eat or drink things that irritate the bladder. These include alcohol, spicy food, and caffeine.  Are overweight or obese. What are the signs or symptoms? Symptoms of this condition include:  Sudden, strong urge to urinate.  Leaking urine.  Urinating 8 or more times a day.  Waking up to urinate 2 or more times a night. How is this diagnosed? Your health care provider may suspect overactive bladder based on your symptoms. He or she will diagnose this condition by:  A physical exam and medical history.  Blood or urine tests. You might need bladder or urine tests to help determine what is causing your overactive bladder. You might also need to see a health care provider who specializes in urinary tract problems (urologist). How is this treated? Treatment for overactive bladder depends on the cause of your condition and whether it is mild or severe. You can also make lifestyle changes at home. Options include:  Bladder training. This may include: ? Learning to control the urge to urinate by following a schedule that directs you to urinate at regular intervals (timed voiding). ? Doing Kegel exercises to strengthen your pelvic floor muscles, which support your bladder. Toning these muscles can help you control urination, even if your bladder muscles are overactive.  Special devices. This may include: ? Biofeedback, which uses sensors to help you become aware of your body's signals. ? Electrical stimulation, which uses electrodes placed inside the body (implanted) or outside the body. These electrodes send gentle pulses of electricity to strengthen the nerves or muscles that control the bladder. ? Women may use a plastic device that fits into the vagina and supports the bladder (pessary).  Medicines. ? Antibiotics to treat bladder infection. ? Antispasmodics to stop the  bladder from releasing urine at the wrong time. ? Tricyclic antidepressants to relax  bladder muscles. ? Injections of botulinum toxin type A directly into the bladder tissue to relax bladder muscles.  Lifestyle changes. This may include: ? Weight loss. Talk to your health care provider about weight loss methods that would work best for you. ? Diet changes. This may include reducing how much alcohol and caffeine you consume, or drinking fluids at different times of the day. ? Not smoking. Do not use any products that contain nicotine or tobacco, such as cigarettes and e-cigarettes. If you need help quitting, ask your health care provider.  Surgery. ? A device may be implanted to help manage the nerve signals that control urination. ? An electrode may be implanted to stimulate electrical signals in the bladder. ? A procedure may be done to change the shape of the bladder. This is done only in very severe cases. Follow these instructions at home: Lifestyle  Make any diet or lifestyle changes that are recommended by your health care provider. These may include: ? Drinking less fluid or drinking fluids at different times of the day. ? Cutting down on caffeine or alcohol. ? Doing Kegel exercises. ? Losing weight if needed. ? Eating a healthy and balanced diet to prevent constipation. This may include:  Eating foods that are high in fiber, such as fresh fruits and vegetables, whole grains, and beans.  Limiting foods that are high in fat and processed sugars, such as fried and sweet foods. General instructions  Take over-the-counter and prescription medicines only as told by your health care provider.  If you were prescribed an antibiotic medicine, take it as told by your health care provider. Do not stop taking the antibiotic even if you start to feel better.  Use any implants or pessary as told by your health care provider.  If needed, wear pads to absorb urine leakage.  Keep a journal  or log to track how much and when you drink and when you feel the need to urinate. This will help your health care provider monitor your condition.  Keep all follow-up visits as told by your health care provider. This is important. Contact a health care provider if:  You have a fever.  Your symptoms do not get better with treatment.  Your pain and discomfort get worse.  You have more frequent urges to urinate. Get help right away if:  You are not able to control your bladder. Summary  Overactive bladder refers to a condition in which a person has a sudden need to pass urine.  Several conditions may lead to an overactive bladder.  Treatment for overactive bladder depends on the cause and severity of your condition.  Follow your health care provider's instructions about lifestyle changes, doing Kegel exercises, keeping a journal, and taking medicines. This information is not intended to replace advice given to you by your health care provider. Make sure you discuss any questions you have with your health care provider. Document Revised: 02/21/2019 Document Reviewed: 11/16/2017 Elsevier Patient Education  [place].

## 2020-02-06 ENCOUNTER — Other Ambulatory Visit: Payer: Self-pay

## 2020-02-06 ENCOUNTER — Ambulatory Visit (INDEPENDENT_AMBULATORY_CARE_PROVIDER_SITE_OTHER): Payer: Self-pay | Admitting: Gastroenterology

## 2020-02-06 VITALS — Ht 67.0 in | Wt 181.0 lb

## 2020-02-06 DIAGNOSIS — Z1211 Encounter for screening for malignant neoplasm of colon: Secondary | ICD-10-CM

## 2020-02-06 MED ORDER — PEG 3350-KCL-NA BICARB-NACL 420 G PO SOLR: 4000.0000 mL | Freq: Once | ORAL | 0 refills | Status: AC

## 2020-02-06 NOTE — Progress Notes (Signed)
Gastroenterology Pre-Procedure Review  Request Date: 03/09/20 Requesting Physician: Dr. Vicente Males  PATIENT REVIEW QUESTIONS: The patient responded to the following health history questions as indicated:    1. Are you having any GI issues? yes (constipation) 2. Do you have a personal history of Polyps? no 3. Do you have a family history of Colon Cancer or Polyps? no 4. Diabetes Mellitus? no 5. Joint replacements in the past 12 months?no 6. Major health problems in the past 3 months?Palpitations.  Wearing heart monitor for the next 30 days. 7. Any artificial heart valves, MVP, or defibrillator?no    MEDICATIONS & ALLERGIES:    Patient reports the following regarding taking any anticoagulation/antiplatelet therapy:   Plavix, Coumadin, Eliquis, Xarelto, Lovenox, Pradaxa, Brilinta, or Effient? no Aspirin? no  Patient confirms/reports the following medications:  Current Outpatient Medications  Medication Sig Dispense Refill  . amoxicillin-clavulanate (AUGMENTIN) 875-125 MG tablet Take 1 tablet by mouth 2 (two) times daily.    Marland Kitchen escitalopram (LEXAPRO) 5 MG tablet Take 5 mg by mouth daily.    . ferrous sulfate 325 (65 FE) MG EC tablet Take 325 mg by mouth 3 (three) times daily with meals.    Marland Kitchen guaiFENesin-codeine (ROBITUSSIN AC) 100-10 MG/5ML syrup Take by mouth.    Marland Kitchen HYDROMET 5-1.5 MG/5ML syrup Take 5 mLs by mouth every 6 (six) hours as needed.    Marland Kitchen LINZESS 145 MCG CAPS capsule TAKE 1 CAPSULE(145 MCG) BY MOUTH DAILY BEFORE BREAKFAST 30 capsule 11  . Multiple Vitamin (MULTI VITAMIN DAILY PO) Take by mouth.    . polyethylene glycol-electrolytes (NULYTELY) 420 g solution Take 4,000 mLs by mouth once for 1 dose. 4000 mL 0  . predniSONE (DELTASONE) 10 MG tablet Take by mouth.     No current facility-administered medications for this visit.    Patient confirms/reports the following allergies:  No Known Allergies  No orders of the defined types were placed in this encounter.   AUTHORIZATION  INFORMATION Primary Insurance: 1D#: Group #:  Secondary Insurance: 1D#: Group #:  SCHEDULE INFORMATION: Date: 03/09/20 Time: Location:[facility]

## 2020-02-06 NOTE — Patient Instructions (Signed)
Colonoscopy has been scheduled for  Monday 03/09/20 at [facility] with Dr. Vicente Males.  Please call the Endoscopy Dept on Friday 04/23/21at 1pm for arrival time.  [phone].  Nulytely Bowel Prep Solution has been sent to [facility] on [address].  [phone].  COVID Test Thursday 03/05/20 arrive at [place] on [facility] between 8am-1pm.  If you have any questions, please call the office at [phone].  Have a great day.  Sharyn Lull, [facility]

## 2020-02-07 ENCOUNTER — Ambulatory Visit: Payer: BC Managed Care – PPO

## 2020-02-07 ENCOUNTER — Other Ambulatory Visit: Payer: Self-pay

## 2020-02-07 DIAGNOSIS — R002 Palpitations: Secondary | ICD-10-CM

## 2020-02-07 DIAGNOSIS — I499 Cardiac arrhythmia, unspecified: Secondary | ICD-10-CM

## 2020-02-10 ENCOUNTER — Encounter: Payer: Self-pay | Admitting: Neurology

## 2020-02-10 ENCOUNTER — Other Ambulatory Visit: Payer: Self-pay

## 2020-02-10 ENCOUNTER — Ambulatory Visit: Payer: BC Managed Care – PPO | Admitting: Neurology

## 2020-02-10 VITALS — BP 127/88 | HR 73 | Ht 67.0 in | Wt 181.0 lb

## 2020-02-10 DIAGNOSIS — G4719 Other hypersomnia: Secondary | ICD-10-CM

## 2020-02-10 DIAGNOSIS — R002 Palpitations: Secondary | ICD-10-CM | POA: Diagnosis not present

## 2020-02-10 DIAGNOSIS — E663 Overweight: Secondary | ICD-10-CM | POA: Diagnosis not present

## 2020-02-10 DIAGNOSIS — R0683 Snoring: Secondary | ICD-10-CM | POA: Diagnosis not present

## 2020-02-10 DIAGNOSIS — Z820 Family history of epilepsy and other diseases of the nervous system: Secondary | ICD-10-CM

## 2020-02-10 DIAGNOSIS — R351 Nocturia: Secondary | ICD-10-CM | POA: Diagnosis not present

## 2020-02-10 NOTE — Patient Instructions (Addendum)

## 2020-02-10 NOTE — Progress Notes (Signed)
Subjective:    Patient ID: Emma Hall is a 54 y.o. female.  HPI     Star Age, MD, PhD [facility] [address] [place], [place] [address]  Dear Dr. Virgina Jock,   I saw your patient, Emma Hall, upon your kind request, in my Sleep clinic today for initial consultation of her sleep disorder, in particular, concern for underlying obstructive sleep apnea.  The patient is unaccompanied today.  As you know, Ms. Emma Hall is a 54 year old right-handed woman with an underlying medical history of anemia, endometriosis, recent palpitations and overweight state, who reports snoring and sleep disruption as well as daytime somnolence.  She reports difficulty maintaining sleep which is not a new problem.  She is reported to snore.  She denies recurrent morning headaches but has nocturia about once per average night.  She is currently pursuing cardiac work-up for palpitations, has a Holter monitor for 30 days, has started it about a week ago.  Echocardiogram recently was benign.  She reports that her sister has sleep apnea and uses a CPAP machine.  Patient tries to be in bed around 9 and rise time is around 5.  She may wake up in the middle of the night around 2 or 3 AM and has trouble going back to sleep.  She denies any telltale symptoms of restless leg syndrome.  Her Past Medical History Is Significant For: Past Medical History:  Diagnosis Date  . Anemia   . Arthritis    right knee right hip lower back  . Endometriosis   . Fibroid   . Heart palpitations     Her Past Surgical History Is Significant For: Past Surgical History:  Procedure Laterality Date  . ABDOMINAL HYSTERECTOMY    . GALLBLADDER SURGERY      Her Family History Is Significant For: Family History  Problem Relation Age of Onset  . Healthy Mother   . Healthy Father   . Breast cancer Maternal Aunt 48  . Breast cancer Maternal Aunt 31  . Breast cancer Maternal Aunt 50     Her Social History Is Significant For: Social History   Socioeconomic History  . Marital status: Single    Spouse name: Not on file  . Number of children: 2  . Years of education: Not on file  . Highest education level: Not on file  Occupational History  . Not on file  Tobacco Use  . Smoking status: Never Smoker  . Smokeless tobacco: Never Used  Substance and Sexual Activity  . Alcohol use: Not Currently    Comment: occass  . Drug use: No  . Sexual activity: Not Currently    Birth control/protection: Surgical  Other Topics Concern  . Not on file  Social History Narrative  . Not on file   Social Determinants of Health   Financial Resource Strain:   . Difficulty of Paying Living Expenses:   Food Insecurity:   . Worried About Charity fundraiser in the Last Year:   . Arboriculturist in the Last Year:   Transportation Needs:   . Film/video editor (Medical):   Marland Kitchen Lack of Transportation (Non-Medical):   Physical Activity:   . Days of Exercise per Week:   . Minutes of Exercise per Session:   Stress:   . Feeling of Stress :   Social Connections:   . Frequency of Communication with Friends and Family:   . Frequency of Social Gatherings with Friends and  Family:   . Attends Religious Services:   . Active Member of Clubs or Organizations:   . Attends Archivist Meetings:   Marland Kitchen Marital Status:     Her Allergies Are:  No Known Allergies:   Her Current Medications Are:  Outpatient Encounter Medications as of 02/10/2020  Medication Sig  . amoxicillin-clavulanate (AUGMENTIN) 875-125 MG tablet Take 1 tablet by mouth 2 (two) times daily.  Marland Kitchen escitalopram (LEXAPRO) 5 MG tablet Take 5 mg by mouth daily.  . ferrous sulfate 325 (65 FE) MG EC tablet Take 325 mg by mouth 3 (three) times daily with meals.  Marland Kitchen guaiFENesin-codeine (ROBITUSSIN AC) 100-10 MG/5ML syrup Take by mouth.  Marland Kitchen HYDROMET 5-1.5 MG/5ML syrup Take 5 mLs by mouth every 6 (six) hours as needed.  Marland Kitchen  LINZESS 145 MCG CAPS capsule TAKE 1 CAPSULE(145 MCG) BY MOUTH DAILY BEFORE BREAKFAST  . Multiple Vitamin (MULTI VITAMIN DAILY PO) Take by mouth.  . predniSONE (DELTASONE) 10 MG tablet Take by mouth.   No facility-administered encounter medications on file as of 02/10/2020.  :  Review of Systems:  Out of a complete 14 point review of systems, all are reviewed and negative with the exception of these symptoms as listed below: Review of Systems  Neurological:       Pt presents today to discuss her sleep. Pt has never had a sleep study but does endorse snoring.  Epworth Sleepiness Scale 0= would never doze 1= slight chance of dozing 2= moderate chance of dozing 3= high chance of dozing  Sitting and reading: 2 Watching TV: 2 Sitting inactive in a public place (ex. Theater or meeting): 0 As a passenger in a car for an hour without a break: 2 Lying down to rest in the afternoon: 3 Sitting and talking to someone: 1 Sitting quietly after lunch (no alcohol): 2 In a car, while stopped in traffic: 1 Total: 13     Objective:  Neurological Exam  Physical Exam Physical Examination:   Vitals:   02/10/20 1052  BP: 127/88  Pulse: 73    General Examination: The patient is a very pleasant 54 y.o. female in no acute distress. She appears well-developed and well-nourished and well groomed.   HEENT: Normocephalic, atraumatic, pupils are equal, round and reactive to light, extraocular tracking is good without limitation to gaze excursion or nystagmus noted. Hearing is grossly intact. Face is symmetric with normal facial animation. Speech is clear with no dysarthria noted. There is no hypophonia. There is no lip, neck/head, jaw or voice tremor. Neck is supple with full range of passive and active motion. There are no carotid bruits on auscultation. Oropharynx exam reveals: mild mouth dryness, good dental hygiene and mild airway crowding, due to smaller airway entry, tonsils of 1-2+, Mallampati is  class II. Tongue protrudes centrally and palate elevates symmetrically. Neck size is 14 3/8 inches. She has a no overbite, slight underbite.   Chest: Clear to auscultation without wheezing, rhonchi or crackles noted.  Heart: S1+S2+0, regular and normal without murmurs, rubs or gallops noted.   Abdomen: Soft, non-tender and non-distended with normal bowel sounds appreciated on auscultation.  Extremities: There is no pitting edema in the distal lower extremities bilaterally.   Skin: Warm and dry without trophic changes noted.   Musculoskeletal: exam reveals no obvious joint deformities, tenderness or joint swelling or erythema.   Neurologically:  Mental status: The patient is awake, alert and oriented in all 4 spheres. Her immediate and remote memory, attention, language  skills and fund of knowledge are appropriate. There is no evidence of aphasia, agnosia, apraxia or anomia. Speech is clear with normal prosody and enunciation. Thought process is linear. Mood is normal and affect is normal.  Cranial nerves II - XII are as described above under HEENT exam.  Motor exam: Normal bulk, strength and tone is noted. There is no tremor, Romberg is negative. Fine motor skills and coordination: grossly intact.  Cerebellar testing: No dysmetria or intention tremor. There is no truncal or gait ataxia.  Sensory exam: intact to light touch in the upper and lower extremities.  Gait, station and balance: She stands easily. No veering to one side is noted. No leaning to one side is noted. Posture is age-appropriate and stance is narrow based. Gait shows normal stride length and normal pace. No problems turning are noted. Tandem walk is unremarkable.                Assessment and plan:  In summary, Taiah Vicker is a very pleasant 54 y.o.-year old female  with an underlying medical history of anemia, endometriosis, recent palpitations and overweight state, whose history and physical exam are concerning for  obstructive sleep apnea (OSA). I had a long chat with the patient about my findings and the diagnosis of OSA, its prognosis and treatment options. We talked about medical treatments, surgical interventions and non-pharmacological approaches. I explained in particular the risks and ramifications of untreated moderate to severe OSA, especially with respect to developing cardiovascular disease down the Road, including congestive heart failure, difficult to treat hypertension, cardiac arrhythmias, or stroke. Even type 2 diabetes has, in part, been linked to untreated OSA. Symptoms of untreated OSA include daytime sleepiness, memory problems, mood irritability and mood disorder such as depression and anxiety, lack of energy, as well as recurrent headaches, especially morning headaches. We talked about trying to maintain a healthy lifestyle in general, as well as the importance of weight control. We also talked about the importance of good sleep hygiene. I recommended the following at this time: sleep study.   I explained the sleep test procedure to the patient and also outlined possible surgical and non-surgical treatment options of OSA, including the use of a custom-made dental device (which would require a referral to a specialist dentist or oral surgeon), upper airway surgical options, such as traditional UPPP or a novel less invasive surgical option in the form of Inspire hypoglossal nerve stimulation (which would involve a referral to an ENT surgeon). I also explained the CPAP treatment option to the patient, who indicated that she would be willing to try CPAP if the need arises. I explained the importance of being compliant with PAP treatment, not only for insurance purposes but primarily to improve Her symptoms, and for the patient's long term health benefit, including to reduce Her cardiovascular risks. I answered all her questions today and the patient was in agreement. I plan to see her back after the  sleep study is completed and encouraged her to call with any interim questions, concerns, problems or updates.   Thank you very much for allowing me to participate in the care of this nice patient. If I can be of any further assistance to you please do not hesitate to call me at [phone].  Sincerely,   Star Age, MD, PhD

## 2020-02-25 ENCOUNTER — Ambulatory Visit (INDEPENDENT_AMBULATORY_CARE_PROVIDER_SITE_OTHER): Payer: BC Managed Care – PPO | Admitting: Neurology

## 2020-02-25 DIAGNOSIS — Z820 Family history of epilepsy and other diseases of the nervous system: Secondary | ICD-10-CM

## 2020-02-25 DIAGNOSIS — R0683 Snoring: Secondary | ICD-10-CM | POA: Diagnosis not present

## 2020-02-25 DIAGNOSIS — G4720 Circadian rhythm sleep disorder, unspecified type: Secondary | ICD-10-CM

## 2020-02-25 DIAGNOSIS — E663 Overweight: Secondary | ICD-10-CM

## 2020-02-25 DIAGNOSIS — R351 Nocturia: Secondary | ICD-10-CM

## 2020-02-25 DIAGNOSIS — G4719 Other hypersomnia: Secondary | ICD-10-CM

## 2020-02-25 DIAGNOSIS — R002 Palpitations: Secondary | ICD-10-CM

## 2020-02-29 ENCOUNTER — Encounter: Payer: Self-pay | Admitting: Obstetrics and Gynecology

## 2020-02-29 LAB — COLOGUARD

## 2020-03-05 ENCOUNTER — Other Ambulatory Visit: Payer: Self-pay

## 2020-03-05 ENCOUNTER — Other Ambulatory Visit
Admission: RE | Admit: 2020-03-05 | Discharge: 2020-03-05 | Disposition: A | Discharge status: Home / Self Care | Payer: BC Managed Care – PPO | Source: Ambulatory Visit | Attending: Gastroenterology | Admitting: Gastroenterology

## 2020-03-05 ENCOUNTER — Telehealth: Payer: Self-pay

## 2020-03-05 ENCOUNTER — Encounter: Payer: Self-pay | Admitting: Neurology

## 2020-03-05 DIAGNOSIS — Z01812 Encounter for preprocedural laboratory examination: Secondary | ICD-10-CM | POA: Diagnosis present

## 2020-03-05 DIAGNOSIS — Z20822 Contact with and (suspected) exposure to covid-19: Secondary | ICD-10-CM | POA: Diagnosis not present

## 2020-03-05 LAB — SARS CORONAVIRUS 2 (TAT 6-24 HRS): SARS Coronavirus 2: NEGATIVE

## 2020-03-05 NOTE — Telephone Encounter (Signed)
-----   Message from Emma Age, MD sent at 03/05/2020  7:46 AM EDT ----- Patient referred by Dr. Virgina Jock, seen by me on 02/10/20, diagnostic PSG on 02/25/20.   Please call and notify the patient that the recent sleep study did not show any significant obstructive sleep apnea with the exception of snoring, which was mild to moderate, and borderline REM related OSA. The absence of supine sleep may have underestimated her sleep disordered breathing. Based on this study, CPAP or autoPAP therapy are not warranted; some weight loss and ongoing avoidance of the supine sleep position may reduce her snoring and REM sleep related sleep apnea.  Please remind patient to try to maintain good sleep hygiene, which means: Keep a regular sleep and wake schedule and make enough time for sleep (7 1/2 to 8 1/2 hours for the average adult), try not to exercise or have a meal within 2 hours of your bedtime, try to keep your bedroom conducive for sleep, that is, cool and dark, without light distractors such as an illuminated alarm clock, and refrain from watching TV right before sleep or in the middle of the night and do not keep the TV or radio on during the night. If a nightlight is used, have it away from the visual field. Also, try not to use or play on electronic devices at bedtime, such as your cell phone, tablet PC or laptop. If you like to read at bedtime on an electronic device, try to dim the background light as much as possible. Do not eat in the middle of the night. Keep pets away from the bedroom environment. For stress relief, try meditation, deep breathing exercises (there are many books and CDs available), a white noise machine or fan can help to diffuse other noise distractors, such as traffic noise. Do not drink alcohol before bedtime, as it can disturb sleep and cause middle of the night awakenings. Never mix alcohol and sedating medications! Avoid narcotic pain medication close to bedtime, as opioids/narcotics can  suppress breathing drive and breathing effort.    She can FU with her PCP and referring provider as scheduled.

## 2020-03-05 NOTE — Telephone Encounter (Signed)
I called pt. I advised pt that Dr. Rexene Alberts reviewed pt's sleep study and found that no significant osa was noted during most recent sleep study. Dr. Rexene Alberts recommends that pt F/u back up with pcp. I reviewed sleep hygiene recommendations with the pt, including trying to keep a regular sleep wake schedule, avoiding electronics in the bedroom, keeping the bedroom cool, dark, and quiet, and avoiding eating or exercising within 2 hours of bedtime as well as eating in the middle of the night. I advised pt to keep pets out of the bedroom. I discussed with pt the importance of stress relief and to try meditation, deep breathing exercises, and/or a white noise machine or fan to diffuse other noise distractors. I advised pt to not drink alcohol before bedtime and to never mix alcohol and sedating medications. Pt was advised to avoid narcotic pain medication close to bedtime. I advised pt that a copy of these sleep study results will be sent to Dr. Virgina Jock. Pt verbalized understanding of results. Pt had no questions at this time but was encouraged to call back if questions arise.

## 2020-03-05 NOTE — Progress Notes (Signed)
Patient referred by Dr. Virgina Jock, seen by me on 02/10/20, diagnostic PSG on 02/25/20.   Please call and notify the patient that the recent sleep study did not show any significant obstructive sleep apnea with the exception of snoring, which was mild to moderate, and borderline REM related OSA. The absence of supine sleep may have underestimated her sleep disordered breathing. Based on this study, CPAP or autoPAP therapy are not warranted; some weight loss and ongoing avoidance of the supine sleep position may reduce her snoring and REM sleep related sleep apnea.  Please remind patient to try to maintain good sleep hygiene, which means: Keep a regular sleep and wake schedule and make enough time for sleep (7 1/2 to 8 1/2 hours for the average adult), try not to exercise or have a meal within 2 hours of your bedtime, try to keep your bedroom conducive for sleep, that is, cool and dark, without light distractors such as an illuminated alarm clock, and refrain from watching TV right before sleep or in the middle of the night and do not keep the TV or radio on during the night. If a nightlight is used, have it away from the visual field. Also, try not to use or play on electronic devices at bedtime, such as your cell phone, tablet PC or laptop. If you like to read at bedtime on an electronic device, try to dim the background light as much as possible. Do not eat in the middle of the night. Keep pets away from the bedroom environment. For stress relief, try meditation, deep breathing exercises (there are many books and CDs available), a white noise machine or fan can help to diffuse other noise distractors, such as traffic noise. Do not drink alcohol before bedtime, as it can disturb sleep and cause middle of the night awakenings. Never mix alcohol and sedating medications! Avoid narcotic pain medication close to bedtime, as opioids/narcotics can suppress breathing drive and breathing effort.    She can FU with her  PCP and referring provider as scheduled.

## 2020-03-05 NOTE — Procedures (Signed)
PATIENT'S NAME:  Emma Hall, Emma Hall DOB:      02-15-1966      MR#:    [id number]     DATE OF RECORDING: 02/25/2020 REFERRING M.D.:  Vernell Leep, MD Study Performed:   Baseline Polysomnogram HISTORY: 54 year old woman with a history of anemia, endometriosis, recent palpitations and overweight state, who reports snoring and sleep disruption as well as daytime somnolence. The patient endorsed the Epworth Sleepiness Scale at 13 points. The patient's weight 181 pounds with a height of 67 (inches), resulting in a BMI of 28.4 kg/m2. The patient's neck circumference measured 14.5 inches.  CURRENT MEDICATIONS: Augmentin, Lexapro, Hydromet, Linzess, Deltasone   PROCEDURE:  This is a multichannel digital polysomnogram utilizing the Somnostar 11.2 system.  Electrodes and sensors were applied and monitored per AASM Specifications.   EEG, EOG, Chin and Limb EMG, were sampled at 200 Hz.  ECG, Snore and Nasal Pressure, Thermal Airflow, Respiratory Effort, CPAP Flow and Pressure, Oximetry was sampled at 50 Hz. Digital video and audio were recorded.      BASELINE STUDY  Lights Out was at 20:53 and Lights On at 05:00.  Total recording time (TRT) was 487.5 minutes, with a total sleep time (TST) of 417 minutes.   The patient's sleep latency was 29 minutes.  REM latency was 162 minutes, which is delayed. The sleep efficiency was 85.5%.     SLEEP ARCHITECTURE: WASO (Wake after sleep onset) was 56.5 minutes with mild to moderate sleep fragmentation noted. There were 32.5 minutes in Stage N1, 228 minutes Stage N2, 115.5 minutes Stage N3 and 41 minutes in Stage REM.  The percentage of Stage N1 was 7.8%, Stage N2 was 54.7%, Stage N3 was 27.7% and Stage R (REM sleep) was 9.8%, which is reduced. The arousals were noted as: 58 were spontaneous, 7 were associated with PLMs, 5 were associated with respiratory events.  RESPIRATORY ANALYSIS:  There were a total of 30 respiratory events:  0 obstructive apneas, 1 central apneas  and 10 mixed apneas with a total of 11 apneas and an apnea index (AI) of 1.6 /hour. There were 19 hypopneas with a hypopnea index of 2.7 /hour. The patient also had 0 respiratory event related arousals (RERAs).      The total APNEA/HYPOPNEA INDEX (AHI) was 4.3/hour and the total RESPIRATORY DISTURBANCE INDEX was  4.3 /hour.  4 events occurred in REM sleep and 41 events in NREM. The REM AHI was  5.9 /hour, versus a non-REM AHI of 4.1. The patient spent 0 minutes of total sleep time in the supine position and 417 minutes in non-supine.. The supine AHI was n/a versus a non-supine AHI of 4.3.  OXYGEN SATURATION & C02:  The Wake baseline 02 saturation was 96%, with the lowest being 89%. Time spent below 89% saturation equaled 0 minutes.  PERIODIC LIMB MOVEMENTS: The patient had a total of 32 Periodic Limb Movements.  The Periodic Limb Movement (PLM) index was 4.6 and the PLM Arousal index was 1./hour. Audio and video analysis did not show any abnormal or unusual movements, behaviors, phonations or vocalizations. The patient took no bathroom breaks. Intermittent snoring was noted, ranging from mild to moderate. The EKG was in keeping with normal sinus rhythm (NSR).  Post-study, the patient indicated that sleep was better than usual.   IMPRESSION:  1. Primary snoring  2. Dysfunctions associated with sleep stages or arousal from sleep  RECOMMENDATIONS:  1. This study does not demonstrate any significant obstructive or central sleep disordered breathing with the  exception of snoring, which was mild to moderate and borderline REM related OSA. The absence of supine sleep may underestimate her sleep disordered breathing. Based on this study, CPAP or autoPAP therapy are not warranted; some weight loss and ongoing avoidance of the supine sleep position may reduce her snoring and REM sleep related sleep apnea.  2. This study shows sleep fragmentation and abnormal sleep stage percentages; these are nonspecific  findings and per se do not signify an intrinsic sleep disorder or a cause for the patient's sleep-related symptoms. Causes include (but are not limited to) the first night effect of the sleep study, circadian rhythm disturbances, medication effect or an underlying mood disorder or medical problem.  3. The patient should be cautioned not to drive, work at heights, or operate dangerous or heavy equipment when tired or sleepy. Review and reiteration of good sleep hygiene measures should be pursued with any patient. 4. The patient will be advised to follow up with the referring provider, who will be notified of the test results.  I certify that I have reviewed the entire raw data recording prior to the issuance of this report in accordance with the Standards of Accreditation of the American Academy of Sleep Medicine (AASM)  Star Age, MD, PhD Diplomat, American Board of Neurology and Sleep Medicine (Neurology and Sleep Medicine)

## 2020-03-09 ENCOUNTER — Ambulatory Visit
Admission: RE | Admit: 2020-03-09 | Discharge: 2020-03-09 | Disposition: A | Discharge status: Home / Self Care | Payer: BC Managed Care – PPO | Attending: Gastroenterology | Admitting: Gastroenterology

## 2020-03-09 ENCOUNTER — Ambulatory Visit: Payer: BC Managed Care – PPO | Admitting: Anesthesiology

## 2020-03-09 ENCOUNTER — Encounter
Admission: RE | Disposition: A | Discharge status: Home / Self Care | Payer: Self-pay | Source: Home / Self Care | Attending: Gastroenterology

## 2020-03-09 ENCOUNTER — Encounter: Payer: Self-pay | Admitting: Gastroenterology

## 2020-03-09 DIAGNOSIS — D120 Benign neoplasm of cecum: Secondary | ICD-10-CM | POA: Insufficient documentation

## 2020-03-09 DIAGNOSIS — D122 Benign neoplasm of ascending colon: Secondary | ICD-10-CM | POA: Diagnosis not present

## 2020-03-09 DIAGNOSIS — K635 Polyp of colon: Secondary | ICD-10-CM

## 2020-03-09 DIAGNOSIS — Z1211 Encounter for screening for malignant neoplasm of colon: Secondary | ICD-10-CM | POA: Diagnosis present

## 2020-03-09 DIAGNOSIS — Z79899 Other long term (current) drug therapy: Secondary | ICD-10-CM | POA: Diagnosis not present

## 2020-03-09 HISTORY — PX: COLONOSCOPY WITH PROPOFOL: SHX5780

## 2020-03-09 SURGERY — COLONOSCOPY WITH PROPOFOL
Anesthesia: General

## 2020-03-09 MED ORDER — PROPOFOL 500 MG/50ML IV EMUL
INTRAVENOUS | Status: DC | PRN
  Administered 2020-03-09: 140 ug/kg/min via INTRAVENOUS

## 2020-03-09 MED ORDER — SODIUM CHLORIDE 0.9 % IV SOLN
INTRAVENOUS | Status: DC
  Administered 2020-03-09: 1000 mL via INTRAVENOUS

## 2020-03-09 MED ORDER — LIDOCAINE HCL (CARDIAC) PF 100 MG/5ML IV SOSY
PREFILLED_SYRINGE | INTRAVENOUS | Status: DC | PRN
  Administered 2020-03-09: 60 mg via INTRAVENOUS

## 2020-03-09 MED ORDER — PROPOFOL 10 MG/ML IV BOLUS
INTRAVENOUS | Status: DC | PRN
  Administered 2020-03-09: 70 mg via INTRAVENOUS
  Administered 2020-03-09: 50 mg via INTRAVENOUS

## 2020-03-09 NOTE — Anesthesia Preprocedure Evaluation (Addendum)
Anesthesia Evaluation  Patient identified by MRN, date of birth, ID band Patient awake    Reviewed: Allergy & Precautions, H&P , NPO status , Patient's Chart, lab work & pertinent test results  Airway Mallampati: II  TM Distance: >3 FB Neck ROM: full    Dental  (+) Teeth Intact   Pulmonary neg pulmonary ROS, neg COPD, Not current smoker,    breath sounds clear to auscultation       Cardiovascular + dysrhythmias (palpitations)  Rhythm:regular Rate:Normal     Neuro/Psych negative neurological ROS  negative psych ROS   GI/Hepatic negative GI ROS, Neg liver ROS,   Endo/Other  negative endocrine ROS  Renal/GU negative Renal ROS  negative genitourinary   Musculoskeletal   Abdominal   Peds  Hematology negative hematology ROS (+)   Anesthesia Other Findings Past Medical History: No date: Anemia No date: Arthritis     Comment:  right knee right hip lower back No date: Endometriosis No date: Fibroid No date: Heart palpitations  Past Surgical History: No date: ABDOMINAL HYSTERECTOMY No date: GALLBLADDER SURGERY     Reproductive/Obstetrics negative OB ROS                            Anesthesia Physical Anesthesia Plan  ASA: II  Anesthesia Plan: General   Post-op Pain Management:    Induction:   PONV Risk Score and Plan: Propofol infusion and TIVA  Airway Management Planned: Natural Airway and Nasal Cannula  Additional Equipment:   Intra-op Plan:   Post-operative Plan:   Informed Consent: I have reviewed the patients History and Physical, chart, labs and discussed the procedure including the risks, benefits and alternatives for the proposed anesthesia with the patient or authorized representative who has indicated his/her understanding and acceptance.     Dental Advisory Given  Plan Discussed with: Anesthesiologist  Anesthesia Plan Comments:         Anesthesia Quick  Evaluation

## 2020-03-09 NOTE — Op Note (Signed)
[facility] Gastroenterology Patient Name: Emma Hall Procedure Date: 03/09/2020 10:02 AM MRN: [id number] Account #: [id number] Date of Birth: [birth year] Admit Type: Outpatient Age: 54 Room: [facility] ENDO ROOM 4 Gender: Female Note Status: Finalized Procedure:             Colonoscopy Indications:           Screening for colorectal malignant neoplasm Providers:             Jonathon Bellows MD, MD Referring MD:          Caren Macadam (Referring MD) Medicines:             Monitored Anesthesia Care Complications:         No immediate complications. Procedure:             Pre-Anesthesia Assessment:                        - Prior to the procedure, a History and Physical was                         performed, and patient medications, allergies and                         sensitivities were reviewed. The patient's tolerance                         of previous anesthesia was reviewed.                        - The risks and benefits of the procedure and the                         sedation options and risks were discussed with the                         patient. All questions were answered and informed                         consent was obtained.                        - ASA Grade Assessment: II - A patient with mild                         systemic disease.                        After obtaining informed consent, the colonoscope was                         passed under direct vision. Throughout the procedure,                         the patient's blood pressure, pulse, and oxygen                         saturations were monitored continuously. The                         Colonoscope was introduced through the anus and  advanced to the the cecum, identified by the                         appendiceal orifice. The colonoscopy was performed                         with ease. The patient tolerated the procedure well.                         The quality of  the bowel preparation was excellent. Findings:      The perianal and digital rectal examinations were normal.      Four sessile polyps were found in the ascending colon. The polyps were 4       to 6 mm in size. These polyps were removed with a cold snare. Resection       and retrieval were complete.      A 4 mm polyp was found in the cecum. The polyp was sessile. The polyp       was removed with a cold snare. Resection and retrieval were complete.      The exam was otherwise without abnormality. Impression:            - Four 4 to 6 mm polyps in the ascending colon,                         removed with a cold snare. Resected and retrieved.                        - One 4 mm polyp in the cecum, removed with a cold                         snare. Resected and retrieved.                        - The examination was otherwise normal. Recommendation:        - Discharge patient to home (with escort).                        - Resume previous diet.                        - Continue present medications.                        - Await pathology results.                        - Repeat colonoscopy in 3 years for surveillance. Procedure Code(s):     --- Professional ---                        [phone], Colonoscopy, flexible; with removal of                         tumor(s), polyp(s), or other lesion(s) by snare                         technique Diagnosis Code(s):     --- Professional ---  K63.5, Polyp of colon                        Z12.11, Encounter for screening for malignant neoplasm                         of colon CPT copyright 2019 American Medical Association. All rights reserved. The codes documented in this report are preliminary and upon coder review may  be revised to meet current compliance requirements. Jonathon Bellows, MD Jonathon Bellows MD, MD 03/09/2020 10:32:29 AM This report has been signed electronically. Number of Addenda: 0 Note Initiated On: 03/09/2020 10:02 AM Scope  Withdrawal Time: 0 hours 16 minutes 18 seconds  Total Procedure Duration: 0 hours 20 minutes 3 seconds  Estimated Blood Loss:  Estimated blood loss: none.      [facility]

## 2020-03-09 NOTE — Transfer of Care (Signed)
Immediate Anesthesia Transfer of Care Note  Patient: Emma Hall  Procedure(s) Performed: COLONOSCOPY WITH PROPOFOL (N/A )  Patient Location: PACU  Anesthesia Type:General  Level of Consciousness: sedated  Airway & Oxygen Therapy: Patient Spontanous Breathing  Post-op Assessment: Report given to RN and Post -op Vital signs reviewed and stable  Post vital signs: Reviewed and stable  Last Vitals:  Vitals Value Taken Time  BP 109/66 03/09/20 1032  Temp    Pulse 80 03/09/20 1033  Resp 21 03/09/20 1033  SpO2 100 % 03/09/20 1033  Vitals shown include unvalidated device data.  Last Pain:  Vitals:   03/09/20 0925  TempSrc: Temporal  PainSc: 0-No pain         Complications: No apparent anesthesia complications

## 2020-03-09 NOTE — H&P (Signed)
Jonathon Bellows, MD [address] [place], [place], [place], [address] [address], [place], [place], [place], [address] Phone: [phone]  Fax: [phone]  Primary Care Physician:  Caren Macadam, MD   Pre-Procedure History & Physical: HPI:  Emma Hall is a 54 y.o. female is here for an colonoscopy.   Past Medical History:  Diagnosis Date  . Anemia   . Arthritis    right knee right hip lower back  . Endometriosis   . Fibroid   . Heart palpitations     Past Surgical History:  Procedure Laterality Date  . ABDOMINAL HYSTERECTOMY    . GALLBLADDER SURGERY      Prior to Admission medications   Medication Sig Start Date End Date Taking? Authorizing Provider  escitalopram (LEXAPRO) 5 MG tablet Take 5 mg by mouth daily. 01/22/20  Yes [provider]  ferrous sulfate 325 (65 FE) MG EC tablet Take 325 mg by mouth 3 (three) times daily with meals.   Yes [provider]  HYDROMET 5-1.5 MG/5ML syrup Take 5 mLs by mouth every 6 (six) hours as needed. 09/09/19  Yes [provider]  LINZESS 145 MCG CAPS capsule TAKE 1 CAPSULE(145 MCG) BY MOUTH DAILY BEFORE BREAKFAST 06/02/19  Yes Rubie Maid, MD  Multiple Vitamin (MULTI VITAMIN DAILY PO) Take by mouth.   Yes [provider]  amoxicillin-clavulanate (AUGMENTIN) 875-125 MG tablet Take 1 tablet by mouth 2 (two) times daily. 09/09/19   [provider]  guaiFENesin-codeine (ROBITUSSIN AC) 100-10 MG/5ML syrup Take by mouth. 09/04/18   [provider]  predniSONE (DELTASONE) 10 MG tablet Take by mouth. 08/25/19   [provider]    Allergies as of 02/06/2020  . (No Known Allergies)    Family History  Problem Relation Age of Onset  . Healthy Mother   . Healthy Father   . Breast cancer Maternal Aunt 48  . Breast cancer Maternal Aunt 60  . Breast cancer Maternal Aunt 72    Social History   Socioeconomic History  . Marital status: Single    Spouse  name: Not on file  . Number of children: 2  . Years of education: Not on file  . Highest education level: Not on file  Occupational History  . Not on file  Tobacco Use  . Smoking status: Never Smoker  . Smokeless tobacco: Never Used  Substance and Sexual Activity  . Alcohol use: Not Currently    Comment: occass  . Drug use: No  . Sexual activity: Not Currently    Birth control/protection: Surgical  Other Topics Concern  . Not on file  Social History Narrative  . Not on file   Social Determinants of Health   Financial Resource Strain:   . Difficulty of Paying Living Expenses:   Food Insecurity:   . Worried About Charity fundraiser in the Last Year:   . Arboriculturist in the Last Year:   Transportation Needs:   . Film/video editor (Medical):   Marland Kitchen Lack of Transportation (Non-Medical):   Physical Activity:   . Days of Exercise per Week:   . Minutes of Exercise per Session:   Stress:   . Feeling of Stress :   Social Connections:   . Frequency of Communication with Friends and Family:   . Frequency of Social Gatherings with Friends and Family:   . Attends Religious Services:   . Active Member of Clubs  or Organizations:   . Attends Archivist Meetings:   Marland Kitchen Marital Status:   Intimate Partner Violence:   . Fear of Current or Ex-Partner:   . Emotionally Abused:   Marland Kitchen Physically Abused:   . Sexually Abused:     Review of Systems: See HPI, otherwise negative ROS  Physical Exam: BP 131/82   Pulse 72   Temp (!) 97.2 F (36.2 C) (Temporal)   Resp 18   Ht 5\' 7"  (1.702 m)   Wt 81.8 kg   SpO2 100%   BMI 28.25 kg/m  General:   Alert,  pleasant and cooperative in NAD Head:  Normocephalic and atraumatic. Neck:  Supple; no masses or thyromegaly. Lungs:  Clear throughout to auscultation, normal respiratory effort.    Heart:  +S1, +S2, Regular rate and rhythm, No edema. Abdomen:  Soft, nontender and nondistended. Normal bowel sounds, without guarding, and  without rebound.   Neurologic:  Alert and  oriented x4;  grossly normal neurologically.  Impression/Plan: Emma Hall is here for an colonoscopy to be performed for Screening colonoscopy average risk   Risks, benefits, limitations, and alternatives regarding  colonoscopy have been reviewed with the patient.  Questions have been answered.  All parties agreeable.   Jonathon Bellows, MD  03/09/2020, 9:59 AM

## 2020-03-10 ENCOUNTER — Encounter: Payer: Self-pay | Admitting: *Deleted

## 2020-03-10 LAB — SURGICAL PATHOLOGY

## 2020-03-10 NOTE — Anesthesia Postprocedure Evaluation (Signed)
Anesthesia Post Note  Patient: Emma Hall  Procedure(s) Performed: COLONOSCOPY WITH PROPOFOL (N/A )  Patient location during evaluation: PACU Anesthesia Type: General Level of consciousness: awake and alert Pain management: pain level controlled Vital Signs Assessment: post-procedure vital signs reviewed and stable Respiratory status: spontaneous breathing, nonlabored ventilation and respiratory function stable Cardiovascular status: blood pressure returned to baseline and stable Postop Assessment: no apparent nausea or vomiting Anesthetic complications: no     Last Vitals:  Vitals:   03/09/20 0925 03/09/20 1032  BP: 131/82 109/66  Pulse: 72   Resp: 18   Temp: (!) 36.2 C (!) 35.7 C  SpO2: 100%     Last Pain:  Vitals:   03/10/20 0733  TempSrc:   PainSc: 0-No pain                 Tera Mater

## 2020-03-12 ENCOUNTER — Encounter: Payer: Self-pay | Admitting: Gastroenterology

## 2020-04-10 NOTE — Progress Notes (Deleted)
Patient referred by Caren Macadam, MD for tachycardia/palpitations  Subjective:   Emma Hall, female    DOB: [birth year], 54 y.o.   MRN: [id number]   No chief complaint on file.    HPI  54 y.o. 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 She has had two distinct episodes of HR up to 130s (as note don her smarth watch) occur at rest- on 01/16/2020 and in September 2019. She denies chest pain, shortness of breath, leg edema, orthopnea, PND, TIA/syncope. She endorses snoring at night, with daytime sleepiness and fatigue. She drinks coffee or alcohol only occasionally.    Past Medical History:  Diagnosis Date   Anemia    Arthritis    right knee right hip lower back   Endometriosis    Fibroid    Heart palpitations      Past Surgical History:  Procedure Laterality Date   ABDOMINAL HYSTERECTOMY     COLONOSCOPY WITH PROPOFOL N/A 03/09/2020   Procedure: COLONOSCOPY WITH PROPOFOL;  Surgeon: Jonathon Bellows, MD;  Location: [facility] ENDOSCOPY;  Service: Gastroenterology;  Laterality: N/A;   GALLBLADDER SURGERY       Social History   Tobacco Use  Smoking Status Never Smoker  Smokeless Tobacco Never Used    Social History   Substance and Sexual Activity  Alcohol Use Not Currently   Comment: occass     Family History  Problem Relation Age of Onset   Healthy Mother    Healthy Father    Breast cancer Maternal Aunt 48   Breast cancer Maternal Aunt 49   Breast cancer Maternal Aunt 50     Current Outpatient Medications on File Prior to Visit  Medication Sig Dispense Refill   amoxicillin-clavulanate (AUGMENTIN) 875-125 MG tablet Take 1 tablet by mouth 2 (two) times daily.     escitalopram (LEXAPRO) 5 MG tablet Take 5 mg by mouth daily.     ferrous sulfate 325 (65 FE) MG EC tablet Take 325 mg by mouth 3 (three) times daily with meals.     guaiFENesin-codeine (ROBITUSSIN AC) 100-10 MG/5ML syrup Take by mouth.     HYDROMET  5-1.5 MG/5ML syrup Take 5 mLs by mouth every 6 (six) hours as needed.     LINZESS 145 MCG CAPS capsule TAKE 1 CAPSULE(145 MCG) BY MOUTH DAILY BEFORE BREAKFAST 30 capsule 11   Multiple Vitamin (MULTI VITAMIN DAILY PO) Take by mouth.     predniSONE (DELTASONE) 10 MG tablet Take by mouth.     No current facility-administered medications on file prior to visit.    Cardiovascular and other pertinent studies:  EKG 01/30/2020: Sinus rhythm 71 bpm.  Normal EKG.   Recent labs: 01/22/2020: Glucose 88, BUN/Cr 12/0.86. EGFR 69. Na/K 143/3.6. Rest of the CMP normal H/H 11.8/35.6. MCV 83. Platelets 331 Chol 186, TG 102, HDL 50, LDL 117 TSH 1.0normal    Review of Systems  Cardiovascular: Positive for palpitations. Negative for chest pain, dyspnea on exertion, leg swelling and syncope.         There were no vitals filed for this visit.   There is no height or weight on file to calculate BMI. There were no vitals filed for this visit.   Objective:   Physical Exam  Constitutional: She appears well-developed and well-nourished.  Neck: No JVD present.  Cardiovascular: Normal rate, regular rhythm, normal heart sounds and intact distal pulses.  No murmur heard. Pulmonary/Chest: Effort normal and breath sounds normal. She has no wheezes. She  has no rales.  Musculoskeletal:        General: No edema.  Nursing note and vitals reviewed.       Assessment & Recommendations:   54 y.o. African American female with palpitations, irregular heart beat  Atria fibrillation is in the differential. Will obtain echocardiogram and event monitoring. Given suspicion for OSA, recommend sleep study.  Further recommendations after above testing.   Thank you for referring the patient to [place]. Please feel free to contact with any questions.  Nigel Mormon, MD [facility] Pager: [phone] Office: [phone]

## 2020-04-15 ENCOUNTER — Encounter: Payer: Self-pay | Admitting: Cardiology

## 2020-04-15 ENCOUNTER — Telehealth: Payer: BC Managed Care – PPO | Admitting: Cardiology

## 2020-04-15 ENCOUNTER — Other Ambulatory Visit: Payer: Self-pay

## 2020-04-15 VITALS — HR 91

## 2020-04-15 DIAGNOSIS — I499 Cardiac arrhythmia, unspecified: Secondary | ICD-10-CM

## 2020-04-15 DIAGNOSIS — R002 Palpitations: Secondary | ICD-10-CM

## 2020-04-15 NOTE — Progress Notes (Signed)
    Patient referred by Emma Macadam, MD for tachycardia/palpitations  Subjective:   Emma Hall, female    DOB: [birth year], 54 y.o.   MRN: [id number]   Chief Complaint  Patient presents with  . Palpitations  . Follow-up     54 y.o. African American female with palpitations, irregular heart beat  Event monitor did not show any significant arrhythmia.Patient has not had any significant palpitations recently, also denies chest pain or shortness of breath.   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 She has had two distinct episodes of HR up to 130s (as note don her smarth watch) occur at rest- on 01/16/2020 and in September 2019. She denies chest pain, shortness of breath, leg edema, orthopnea, PND, TIA/syncope. She endorses snoring at night, with daytime sleepiness and fatigue. She drinks coffee or alcohol only occasionally.     Current Outpatient Medications on File Prior to Visit  Medication Sig Dispense Refill  . escitalopram (LEXAPRO) 5 MG tablet Take 5 mg by mouth daily.    . ferrous sulfate 325 (65 FE) MG EC tablet Take 325 mg by mouth 3 (three) times daily with meals.    Marland Kitchen LINZESS 145 MCG CAPS capsule TAKE 1 CAPSULE(145 MCG) BY MOUTH DAILY BEFORE BREAKFAST 30 capsule 11  . Multiple Vitamin (MULTI VITAMIN DAILY PO) Take by mouth.     No current facility-administered medications on file prior to visit.    Cardiovascular and other pertinent studies:  14 - day Mobile Cardiac Ambulatory Telemetry.  Enrollment Period: 02/07/2020-03/01/2020: Dominant rhythm: Sinus. HR 54-154 bpm. Avg HR 84 bpm. No atrial fibrillation/atrial flutter/SVT/VT/high grade AV block, sinus pause >3sec noted. Rare supraventricular and ventricular ectopy. Patient triggered events correlate with sinus rhythm  Echocardiogram 02/07/2020:  Normal LV systolic function with EF 71%. Left ventricle cavity is normal  in size. Mild concentric hypertrophy of the left ventricle. Normal  global  wall motion. Normal diastolic filling pattern. Calculated EF 71%.  Normal echocardiogram.   EKG 01/30/2020: Sinus rhythm 71 bpm.  Normal EKG.   Recent labs: 01/22/2020: Glucose 88, BUN/Cr 12/0.86. EGFR 69. Na/K 143/3.6. Rest of the CMP normal H/H 11.8/35.6. MCV 83. Platelets 331 Chol 186, TG 102, HDL 50, LDL 117 TSH 1.0normal    Review of Systems  Cardiovascular: Negative for chest pain, dyspnea on exertion, leg swelling, palpitations and syncope.        Vitals:   04/15/20 1238  Pulse: 91     Objective:   Physical Exam  Not performed. Telephone visit.      Assessment & Recommendations:   54 y.o. African American female with palpitations, irregular heart beat  No Afib on monitor. Normal echocardiogram.  Continue follow up with PCP. I will see him on as needed basis.   Nigel Mormon, MD [facility]. [facility] Pager: [phone] Office: [phone]

## 2020-05-21 ENCOUNTER — Telehealth: Payer: Self-pay

## 2020-05-21 NOTE — Telephone Encounter (Signed)
Patient called and aware of her cologuard results which were negative. Mailed a copy of pt's cologuard results to her using the address on file.

## 2020-10-12 ENCOUNTER — Ambulatory Visit
Admission: RE | Admit: 2020-10-12 | Discharge: 2020-10-12 | Disposition: A | Discharge status: Home / Self Care | Payer: BC Managed Care – PPO | Source: Ambulatory Visit | Attending: Obstetrics and Gynecology | Admitting: Obstetrics and Gynecology

## 2020-10-12 ENCOUNTER — Other Ambulatory Visit: Payer: Self-pay

## 2020-10-12 DIAGNOSIS — Z1231 Encounter for screening mammogram for malignant neoplasm of breast: Secondary | ICD-10-CM

## 2021-01-13 ENCOUNTER — Ambulatory Visit: Payer: Self-pay

## 2021-01-13 ENCOUNTER — Ambulatory Visit: Payer: BC Managed Care – PPO | Admitting: Orthopaedic Surgery

## 2021-01-13 ENCOUNTER — Other Ambulatory Visit: Payer: Self-pay

## 2021-01-13 ENCOUNTER — Encounter: Payer: Self-pay | Admitting: Orthopaedic Surgery

## 2021-01-13 VITALS — Ht 67.0 in | Wt 190.0 lb

## 2021-01-13 DIAGNOSIS — M25532 Pain in left wrist: Secondary | ICD-10-CM | POA: Diagnosis not present

## 2021-01-13 DIAGNOSIS — M25531 Pain in right wrist: Secondary | ICD-10-CM | POA: Diagnosis not present

## 2021-01-13 NOTE — Progress Notes (Signed)
Office Visit Note   Patient: Emma Hall           Date of Birth: 14-May-1966           MRN: [id number] Visit Date: 01/13/2021              Requested by: Caren Macadam, MD [address] [place],  [place] [address] PCP: Caren Macadam, MD   Assessment & Plan: Visit Diagnoses:  1. Bilateral wrist pain     Plan: [name] has what appears to be an asymptomatic dorsal left wrist ganglion.  No obvious changes by x-ray.  Long discussion regarding the diagnosis and different treatment options including using Voltaren gel or anti-inflammatory medicines when it is symptomatic.  No evidence of carpal tunnel.  Right hand has some discomfort at the base of the thumb carpometacarpal joint I suspect she has some early arthritis.  Minimal change by x-ray.  Also of the discussed use of NSAIDs and Voltaren gel.  Consider cortisone injections over time but did not appear to be that symptomatic today  Follow-Up Instructions: Return if symptoms worsen or fail to improve.   Orders:  Orders Placed This Encounter  Procedures  . XR Wrist 2 Views Left  . XR Wrist 2 Views Right   No orders of the defined types were placed in this encounter.     Procedures: No procedures performed   Clinical Data: No additional findings.   Subjective: Chief Complaint  Patient presents with  . Right Wrist - Pain  . Left Wrist - Pain  Patient presents today for bilateral wrist pain. She said that they started to hurt around 4 months ago. No known injury. The left side is worse than the right. She states that she cannot grip anything without pain in her hands and wrist. She noticed a knot on the posterior aspect of her left wrist. Both thumbs are sore. She is right hand dominant. She does not take anything for pain.   HPI  Review of Systems   Objective: Vital Signs: Ht 5\' 7"  (1.702 m)   Wt 190 lb (86.2 kg)   BMI 29.76 kg/m   Physical Exam Constitutional:      Appearance: She is well-developed  and well-nourished.  HENT:     Mouth/Throat:     Mouth: Oropharynx is clear and moist.  Eyes:     Extraocular Movements: EOM normal.     Pupils: Pupils are equal, round, and reactive to light.  Pulmonary:     Effort: Pulmonary effort is normal.  Skin:    General: Skin is warm and dry.  Neurological:     Mental Status: She is alert and oriented to person, place, and time.  Psychiatric:        Mood and Affect: Mood and affect normal.        Behavior: Behavior normal.     Ortho Exam left wrist with what appears to be a very small mid carpal ganglion cyst.  No redness no erythema.  Full range of motion of her fingers.  No Tinel's or Phalen's about the median nerve.  No obvious degenerative changes.  No pain about the thumb.  No pain around the radiocarpal joint either medially or laterally.  Neurologically intact.  Right wrist without any local tenderness or masses.  Little bit of pain at the base of the thumb carpometacarpal joint with a minimally positive grind test.  No swelling.  Full fist.  Neurologically intact.  Negative Tinel's and Phalen's  about the median nerve  Specialty Comments:  No specialty comments available.  Imaging: XR Wrist 2 Views Left  Result Date: 01/13/2021 Films of the left wrist were obtained in several projections.  No obvious degenerative changes or acute changes.  Patient has what appears to be a ganglion cyst in the mid dorsum of the wrist but no obvious cause by x-ray  XR Wrist 2 Views Right  Result Date: 01/13/2021 Films of the right wrist were obtained in several projections.  There is no acute change.  Possibly very minimal subluxation at the base of the thumb carpometacarpal joint with some minimal degenerative changes.  Carpus appears to be intact.  No ulnar variance radiocarpal joint intact    PMFS History: Patient Active Problem List   Diagnosis Date Noted  . Bilateral wrist pain 01/13/2021  . Palpitations 01/30/2020  . Irregular heart beat  01/30/2020  . Snoring 01/30/2020  . Unilateral primary osteoarthritis, right knee 01/29/2020  . Unilateral primary osteoarthritis, right hip 01/29/2020  . Low back pain 09/05/2019  . DDD (degenerative disc disease), lumbosacral 09/05/2019  . AVN (avascular necrosis of bone) ([place]) 09/05/2019  . Baker cyst, right 09/05/2019  . Pain in right foot 01/16/2019  . Pain in right hand 01/16/2019  . Overweight (BMI 25.0-29.9) 12/14/2017  . Post-menopause on HRT (hormone replacement therapy) 12/14/2017   Past Medical History:  Diagnosis Date  . Anemia   . Arthritis    right knee right hip lower back  . Endometriosis   . Fibroid   . Heart palpitations     Family History  Problem Relation Age of Onset  . Healthy Mother   . Healthy Father   . Breast cancer Maternal Aunt 48  . Breast cancer Maternal Aunt 19  . Breast cancer Maternal Aunt 10    Past Surgical History:  Procedure Laterality Date  . ABDOMINAL HYSTERECTOMY    . COLONOSCOPY WITH PROPOFOL N/A 03/09/2020   Procedure: COLONOSCOPY WITH PROPOFOL;  Surgeon: Jonathon Bellows, MD;  Location: [facility] ENDOSCOPY;  Service: Gastroenterology;  Laterality: N/A;  . GALLBLADDER SURGERY     Social History   Occupational History  . Not on file  Tobacco Use  . Smoking status: Never Smoker  . Smokeless tobacco: Never Used  Vaping Use  . Vaping Use: Never used  Substance and Sexual Activity  . Alcohol use: Not Currently    Comment: occass  . Drug use: No  . Sexual activity: Not Currently    Birth control/protection: Surgical

## 2021-02-09 ENCOUNTER — Ambulatory Visit (INDEPENDENT_AMBULATORY_CARE_PROVIDER_SITE_OTHER): Payer: BC Managed Care – PPO | Admitting: Obstetrics and Gynecology

## 2021-02-09 ENCOUNTER — Encounter: Payer: Self-pay | Admitting: Obstetrics and Gynecology

## 2021-02-09 ENCOUNTER — Other Ambulatory Visit: Payer: Self-pay

## 2021-02-09 VITALS — BP 116/69 | HR 88 | Ht 67.0 in | Wt 193.9 lb

## 2021-02-09 DIAGNOSIS — Z01419 Encounter for gynecological examination (general) (routine) without abnormal findings: Secondary | ICD-10-CM | POA: Diagnosis not present

## 2021-02-09 DIAGNOSIS — Z1231 Encounter for screening mammogram for malignant neoplasm of breast: Secondary | ICD-10-CM | POA: Diagnosis not present

## 2021-02-09 DIAGNOSIS — E669 Obesity, unspecified: Secondary | ICD-10-CM | POA: Diagnosis not present

## 2021-02-09 DIAGNOSIS — N952 Postmenopausal atrophic vaginitis: Secondary | ICD-10-CM

## 2021-02-09 DIAGNOSIS — Z780 Asymptomatic menopausal state: Secondary | ICD-10-CM

## 2021-02-09 NOTE — Patient Instructions (Addendum)
Preventive Care 54-54 Years Old, Female Preventive care refers to lifestyle choices and visits with your health care provider that can promote health and wellness. This includes:  A yearly physical exam. This is also called an annual wellness visit.  Regular dental and eye exams.  Immunizations.  Screening for certain conditions.  Healthy lifestyle choices, such as: ? Eating a healthy diet. ? Getting regular exercise. ? Not using drugs or products that contain nicotine and tobacco. ? Limiting alcohol use. What can I expect for my preventive care visit? Physical exam Your health care provider will check your:  Height and weight. These may be used to calculate your BMI (body mass index). BMI is a measurement that tells if you are at a healthy weight.  Heart rate and blood pressure.  Body temperature.  Skin for abnormal spots. Counseling Your health care provider may ask you questions about your:  Past medical problems.  Family's medical history.  Alcohol, tobacco, and drug use.  Emotional well-being.  Home life and relationship well-being.  Sexual activity.  Diet, exercise, and sleep habits.  Work and work Statistician.  Access to firearms.  Method of birth control.  Menstrual cycle.  Pregnancy history. What immunizations do I need? Vaccines are usually given at various ages, according to a schedule. Your health care provider will recommend vaccines for you based on your age, medical history, and lifestyle or other factors, such as travel or where you work.   What tests do I need? Blood tests  Lipid and cholesterol levels. These may be checked every 5 years, or more often if you are over 54 years old.  Hepatitis C test.  Hepatitis B test. Screening  Lung cancer screening. You may have this screening every year starting at age 54 if you have a 30-pack-year history of smoking and currently smoke or have quit within the past 15 years.  Colorectal cancer  screening. ? All adults should have this screening starting at age 54 and continuing until age 17. ? Your health care provider may recommend screening at age 54 if you are at increased risk. ? You will have tests every 1-10 years, depending on your results and the type of screening test.  Diabetes screening. ? This is done by checking your blood sugar (glucose) after you have not eaten for a while (fasting). ? You may have this done every 1-3 years.  Mammogram. ? This may be done every 1-2 years. ? Talk with your health care provider about when you should start having regular mammograms. This may depend on whether you have a family history of breast cancer.  BRCA-related cancer screening. This may be done if you have a family history of breast, ovarian, tubal, or peritoneal cancers.  Pelvic exam and Pap test. ? This may be done every 3 years starting at age 10. ? Starting at age 11, this may be done every 5 years if you have a Pap test in combination with an HPV test. Other tests  STD (sexually transmitted disease) testing, if you are at risk.  Bone density scan. This is done to screen for osteoporosis. You may have this scan if you are at high risk for osteoporosis. Talk with your health care provider about your test results, treatment options, and if necessary, the need for more tests. Follow these instructions at home: Eating and drinking  Eat a diet that includes fresh fruits and vegetables, whole grains, lean protein, and low-fat dairy products.  Take vitamin and mineral supplements  as recommended by your health care provider.  Do not drink alcohol if: ? Your health care provider tells you not to drink. ? You are pregnant, may be pregnant, or are planning to become pregnant.  If you drink alcohol: ? Limit how much you have to 0-1 drink a day. ? Be aware of how much alcohol is in your drink. In the [place] one drink equals one 12 oz bottle of beer (355 mL), one 5 oz glass of  wine (148 mL), or one 1 oz glass of hard liquor (44 mL).   Lifestyle  Take daily care of your teeth and gums. Brush your teeth every morning and night with fluoride toothpaste. Floss one time each day.  Stay active. Exercise for at least 30 minutes 5 or more days each week.  Do not use any products that contain nicotine or tobacco, such as cigarettes, e-cigarettes, and chewing tobacco. If you need help quitting, ask your health care provider.  Do not use drugs.  If you are sexually active, practice safe sex. Use a condom or other form of protection to prevent STIs (sexually transmitted infections).  If you do not wish to become pregnant, use a form of birth control. If you plan to become pregnant, see your health care provider for a prepregnancy visit.  If told by your health care provider, take low-dose aspirin daily starting at age 54.  Find healthy ways to cope with stress, such as: ? Meditation, yoga, or listening to music. ? Journaling. ? Talking to a trusted person. ? Spending time with friends and family. Safety  Always wear your seat belt while driving or riding in a vehicle.  Do not drive: ? If you have been drinking alcohol. Do not ride with someone who has been drinking. ? When you are tired or distracted. ? While texting.  Wear a helmet and other protective equipment during sports activities.  If you have firearms in your house, make sure you follow all gun safety procedures. What's next?  Visit your health care provider once a year for an annual wellness visit.  Ask your health care provider how often you should have your eyes and teeth checked.  Stay up to date on all vaccines. This information is not intended to replace advice given to you by your health care provider. Make sure you discuss any questions you have with your health care provider. Document Revised: 08/04/2020 Document Reviewed: 07/12/2018 Elsevier Patient Education  2021 [employer] Breast  Self-Awareness Breast self-awareness is knowing how your breasts look and feel. Doing breast self-awareness is important. It allows you to catch a breast problem early while it is still small and can be treated. All women should do breast self-awareness, including women who have had breast implants. Tell your doctor if you notice a change in your breasts. What you need:  A mirror.  A well-lit room. How to do a breast self-exam A breast self-exam is one way to learn what is normal for your breasts and to check for changes. To do a breast self-exam: Look for changes 1. Take off all the clothes above your waist. 2. Stand in front of a mirror in a room with good lighting. 3. Put your hands on your hips. 4. Push your hands down. 5. Look at your breasts and nipples in the mirror to see if one breast or nipple looks different from the other. Check to see if: ? The shape of one breast is different. ? The size of   size of one breast is different. ? There are wrinkles, dips, and bumps in one breast and not the other. 6. Look at each breast for changes in the skin, such as: ? Redness. ? Scaly areas. 7. Look for changes in your nipples, such as: ? Liquid around the nipples. ? Bleeding. ? Dimpling. ? Redness. ? A change in where the nipples are.   Feel for changes 1. Lie on your back on the floor. 2. Feel each breast. To do this, follow these steps: ? Pick a breast to feel. ? Put the arm closest to that breast above your head. ? Use your other arm to feel the nipple area of your breast. Feel the area with the pads of your three middle fingers by making small circles with your fingers. For the first circle, press lightly. For the second circle, press harder. For the third circle, press even harder. ? Keep making circles with your fingers at the different pressures as you move down your breast. Stop when you feel your ribs. ? Move your fingers a little toward the center of your  body. ? Start making circles with your fingers again, this time going up until you reach your collarbone. ? Keep making up-and-down circles until you reach your armpit. Remember to keep using the three pressures. ? Feel the other breast in the same way. 3. Sit or stand in the tub or shower. 4. With soapy water on your skin, feel each breast the same way you did in step 2 when you were lying on the floor.   Write down what you find Writing down what you find can help you remember what to tell your doctor. Write down:  What is normal for each breast.  Any changes you find in each breast, including: ? The kind of changes you find. ? Whether you have pain. ? Size and location of any lumps.  When you last had your menstrual period. General tips  Check your breasts every month.  If you are breastfeeding, the best time to check your breasts is after you feed your baby or after you use a breast pump.  If you get menstrual periods, the best time to check your breasts is 5-7 days after your menstrual period is over.  With time, you will become comfortable with the self-exam, and you will begin to know if there are changes in your breasts. Contact a doctor if you:  See a change in the shape or size of your breasts or nipples.  See a change in the skin of your breast or nipples, such as red or scaly skin.  Have fluid coming from your nipples that is not normal.  Find a lump or thick area that was not there before.  Have pain in your breasts.  Have any concerns about your breast health. Summary  Breast self-awareness includes looking for changes in your breasts, as well as feeling for changes within your breasts.  Breast self-awareness should be done in front of a mirror in a well-lit room.  You should check your breasts every month. If you get menstrual periods, the best time to check your breasts is 5-7 days after your menstrual period is over.  Let your doctor know of any changes  you see in your breasts, including changes in size, changes on the skin, pain or tenderness, or fluid from your nipples that is not normal. This information is not intended to replace advice given to you by your health care provider.  Make sure you discuss any questions you have with your health care provider. Document Revised: 06/19/2018 Document Reviewed: 06/19/2018 Elsevier Patient Education  2021 [employer].    Preventing Unhealthy [employer], Adult Staying at a healthy weight is important to your overall health. When fat builds up in your body, you may become overweight or obese. Being overweight or obese increases your risk of developing certain health problems, such as heart disease, diabetes, sleeping problems, joint problems, and some types of cancer. Unhealthy weight gain is often the result of making unhealthy food choices or not getting enough exercise. You can make changes to your lifestyle to prevent obesity and stay as healthy as possible. What nutrition changes can be made?  Eat only as much as your body needs. To do this: ? Pay attention to signs that you are hungry or full. Stop eating as soon as you feel full. ? If you feel hungry, try drinking water first before eating. Drink enough water so your urine is clear or pale yellow. ? Eat smaller portions. Pay attention to portion sizes when eating out. ? Look at serving sizes on food labels. Most foods contain more than one serving per container. ? Eat the recommended number of calories for your gender and activity level. For most active people, a daily total of 2,000 calories is appropriate. If you are trying to lose weight or are not very active, you may need to eat fewer calories. Talk with your health care provider or a diet and nutrition specialist (dietitian) about how many calories you need each day.  Choose healthy foods, such as: ? Fruits and vegetables. At each meal, try to fill at least half of your plate with fruits  and vegetables. ? Whole grains, such as whole-wheat bread, brown rice, and quinoa. ? Lean meats, such as chicken or fish. ? Other healthy proteins, such as beans, eggs, or tofu. ? Healthy fats, such as nuts, seeds, fatty fish, and olive oil. ? Low-fat or fat-free dairy products.  Check food labels, and avoid food and drinks that: ? Are high in calories. ? Have added sugar. ? Are high in sodium. ? Have saturated fats or trans fats.  Cook foods in healthier ways, such as by baking, broiling, or grilling.  Make a meal plan for the week, and shop with a grocery list to help you stay on track with your purchases. Try to avoid going to the grocery store when you are hungry.  When grocery shopping, try to shop around the outside of the store first, where the fresh foods are. Doing this helps you to avoid prepackaged foods, which can be high in sugar, salt (sodium), and fat.   What lifestyle changes can be made?  Exercise for 30 or more minutes on 5 or more days each week. Exercising may include brisk walking, yard work, biking, running, swimming, and team sports like basketball and soccer. Ask your health care provider which exercises are safe for you.  Do muscle-strengthening activities, such as lifting weights or using resistance bands, on 2 or more days a week.  Do not use any products that contain nicotine or tobacco, such as cigarettes and e-cigarettes. If you need help quitting, ask your health care provider.  Limit alcohol intake to no more than 1 drink a day for nonpregnant women and 2 drinks a day for men. One drink equals 12 oz of beer, 5 oz of wine, or 1 oz of hard liquor.  Try to get 7-9 hours  of sleep each night.   What other changes can be made?  Keep a food and activity journal to keep track of: ? What you ate and how many calories you had. Remember to count the calories in sauces, dressings, and side dishes. ? Whether you were active, and what exercises you did. ? Your  calorie, weight, and activity goals.  Check your weight regularly. Track any changes. If you notice you have gained weight, make changes to your diet or activity routine.  Avoid taking weight-loss medicines or supplements. Talk to your health care provider before starting any new medicine or supplement.  Talk to your health care provider before trying any new diet or exercise plan. Why are these changes important? Eating healthy, staying active, and having healthy habits can help you to prevent obesity. Those changes also:  Help you manage stress and emotions.  Help you connect with friends and family.  Improve your self-esteem.  Improve your sleep.  Prevent long-term health problems. What can happen if changes are not made? Being obese or overweight can cause you to develop joint or bone problems, which can make it hard for you to stay active or do activities you enjoy. Being obese or overweight also puts stress on your heart and lungs and can lead to health problems like diabetes, heart disease, and some cancers. Where to find more information Talk with your health care provider or a dietitian about healthy eating and healthy lifestyle choices. You may also find information from:  [place] Department of Agriculture, MyPlate: FormerBoss.no  American Heart Association: www.heart.org  Centers for Disease Control and Prevention: http://www.wolf.info/ Summary  Staying at a healthy weight is important to your overall health. It helps you to prevent certain diseases and health problems, such as heart disease, diabetes, joint problems, sleep disorders, and some types of cancer.  Being obese or overweight can cause you to develop joint or bone problems, which can make it hard for you to stay active or do activities you enjoy.  You can prevent unhealthy weight gain by eating a healthy diet, exercising regularly, not smoking, limiting alcohol, and getting enough sleep.  Talk with your health  care provider or a dietitian for guidance about healthy eating and healthy lifestyle choices. This information is not intended to replace advice given to you by your health care provider. Make sure you discuss any questions you have with your health care provider. Document Revised: 02/27/2020 Document Reviewed: 02/27/2020 Elsevier Patient Education  2021 [employer].

## 2021-02-09 NOTE — Progress Notes (Signed)
Pt present for annual exam. Pt stated that she was doing well no problems.  

## 2021-02-09 NOTE — Progress Notes (Signed)
ANNUAL PREVENTATIVE CARE GYNECOLOGY  ENCOUNTER NOTE  Subjective:       Emma Hall is a 55 y.o. G2P2 female here to establish care, and for a routine annual gynecologic exam. The patient is sexually active. The patient is not curently taking hormone replacement therapy (h/o OTC Estroven use in the past, x 2 years). Patient denies post-menopausal vaginal bleeding. The patient wears seatbelts: yes. The patient participates in regular exercise: no. Has the patient ever been transfused or tattooed?: no.   Patient has the following complaints today: 1. Difficulties with losing weight. Does report issues with stress eating lately. Notes grandson was recently diagnosed with autism. Is doing Weight Watchers (but not consistently).  2. Reports that her history of urinary urgency has improved with dietary modifications (avoiding bladder irritants).    Gynecologic History No LMP recorded. Patient has had a hysterectomy. Contraception: status post hysterectomy Last Pap: 2015. Results were: normal. No longer needed as patient has had hysterectomy.  Last mammogram: 10/12/2020. Results were: normal Last Colonoscopy: ~ 8 years ago. Results were: normal per patient.    Obstetric History OB History  Gravida Para Term Preterm AB Living  2 2          SAB IAB Ectopic Multiple Live Births               # Outcome Date GA Lbr Len/2nd Weight Sex Delivery Anes PTL Lv  2 Para 1991    F CS-LTranv     1 Para 1989    M CS-LTranv       Past Medical History:  Diagnosis Date  . Anemia   . Arthritis    right knee right hip lower back  . Endometriosis   . Fibroid   . Heart palpitations   . Vitamin D deficiency     Family History  Problem Relation Age of Onset  . Healthy Mother   . Healthy Father   . Breast cancer Maternal Aunt 48  . Breast cancer Maternal Aunt 60  . Breast cancer Maternal Aunt 54  . Breast cancer Sister   . Stroke Sister     Past Surgical History:  Procedure Laterality  Date  . ABDOMINAL HYSTERECTOMY    . COLONOSCOPY WITH PROPOFOL N/A 03/09/2020   Procedure: COLONOSCOPY WITH PROPOFOL;  Surgeon: Jonathon Bellows, MD;  Location[facility] ENDOSCOPY;  Service: Gastroenterology;  Laterality: N/A;  . GALLBLADDER SURGERY      Social History   Socioeconomic History  . Marital status: Single    Spouse name: Not on file  . Number of children: 2  . Years of education: Not on file  . Highest education level: Not on file  Occupational History  . Not on file  Tobacco Use  . Smoking status: Never Smoker  . Smokeless tobacco: Never Used  Vaping Use  . Vaping Use: Never used  Substance and Sexual Activity  . Alcohol use: Not Currently    Comment: occass  . Drug use: No  . Sexual activity: Not Currently    Birth control/protection: Surgical  Other Topics Concern  . Not on file  Social History Narrative  . Not on file   Social Determinants of Health   Financial Resource Strain: Not on file  Food Insecurity: Not on file  Transportation Needs: Not on file  Physical Activity: Not on file  Stress: Not on file  Social Connections: Not on file  Intimate Partner Violence: Not on file    Current Outpatient Medications on File  Prior to Visit  Medication Sig Dispense Refill  . escitalopram (LEXAPRO) 5 MG tablet Take 5 mg by mouth daily.    . ferrous sulfate 325 (65 FE) MG EC tablet Take 325 mg by mouth 3 (three) times daily with meals.    Marland Kitchen LINZESS 145 MCG CAPS capsule TAKE 1 CAPSULE(145 MCG) BY MOUTH DAILY BEFORE BREAKFAST 30 capsule 11  . Multiple Vitamin (MULTI VITAMIN DAILY PO) Take by mouth.    . vitamin B-12 (CYANOCOBALAMIN) 1000 MCG tablet Take 1,000 mcg by mouth daily.    . Vitamin D, Ergocalciferol, (DRISDOL) 1.25 MG (50000 UNIT) CAPS capsule Take 50,000 Units by mouth every 7 (seven) days.     No current facility-administered medications on file prior to visit.    No Known Allergies    Review of Systems ROS Review of Systems - General ROS: negative  for - chills, fatigue, fever, hot flashes, night sweats, or weight loss. Positive for weight gain.  Psychological ROS: negative for - anxiety, decreased libido, depression, mood swings, physical abuse or sexual abuse Ophthalmic ROS: negative for - blurry vision, eye pain or loss of vision ENT ROS: negative for - headaches, hearing change, visual changes or vocal changes Allergy and Immunology ROS: negative for - hives, itchy/watery eyes or seasonal allergies Hematological and Lymphatic ROS: negative for - bleeding problems, bruising, swollen lymph nodes or weight loss Endocrine ROS: negative for - galactorrhea, hair pattern changes, hot flashes, malaise/lethargy, mood swings, palpitations, polydipsia/polyuria, skin changes, temperature intolerance or unexpected weight changes Breast ROS: negative for - new or changing breast lumps or nipple discharge Respiratory ROS: negative for - cough or shortness of breath Cardiovascular ROS: negative for - chest pain, irregular heartbeat, palpitations or shortness of breath Gastrointestinal ROS: no abdominal pain, change in bowel habits, or black or bloody stools.  Genito-Urinary ROS: no dysuria, hematuria.   Musculoskeletal ROS: negative for - joint pain or joint stiffness.  Neurological ROS: negative for - bowel and bladder control changes Dermatological ROS: negative for rash and skin lesion changes   Objective:   BP 116/69   Pulse 88   Ht 5\' 7"  (1.702 m)   Wt 193 lb 14.4 oz (88 kg)   BMI 30.37 kg/m .  CONSTITUTIONAL: Well-developed, well-nourished female in no acute distress. Obesity (mild).  PSYCHIATRIC: Normal mood and affect. Normal behavior. Normal judgment and thought content. [place]: Alert and oriented to person, place, and time. Normal muscle tone coordination. No cranial nerve deficit noted. HENT:  Normocephalic, atraumatic, External right and left ear normal. Oropharynx is clear and moist EYES: Conjunctivae and EOM are normal. Pupils  are equal, round, and reactive to light. No scleral icterus.  NECK: Normal range of motion, supple, no masses.  Normal thyroid.  SKIN: Skin is warm and dry. No rash noted. Not diaphoretic. No erythema. No pallor. CARDIOVASCULAR: Normal heart rate noted, regular rhythm, no murmur. RESPIRATORY: Clear to auscultation bilaterally. Effort and breath sounds normal, no problems with respiration noted. BREASTS: Symmetric in size. No masses, skin changes, nipple drainage, or lymphadenopathy. ABDOMEN: Soft, normal bowel sounds, no distention noted.  No tenderness, rebound or guarding.  BLADDER: Normal PELVIC:  Bladder no bladder distension noted  Urethra: normal appearing urethra with no masses, tenderness or lesions  Vulva: normal appearing vulva with no masses, tenderness or lesions  Vagina: mild vaginal atrophy present.  No discharge, no lesions  Cervix: normal appearing cervix without discharge or lesions  Uterus: uterus is normal size, shape, consistency and nontender  Adnexa:  normal adnexa in size, nontender and no masses  RV: External Exam NormaI, No Rectal Masses and Normal Sphincter tone  MUSCULOSKELETAL: Normal range of motion. No tenderness.  No cyanosis, clubbing, or edema.  2+ distal pulses. LYMPHATIC: No Axillary, Supraclavicular, or Inguinal Adenopathy.   Labs: Labs performed by PCP.   Assessment:   Annual gynecologic examination 54 y.o. Contraception: post menopausal status Obesity 1  Menopausal  Vaginal atrophy  Plan:  - Pap: Not needed. Patient is s/p hysterectomy . - Mammogram: Ordered.  - Stool Guaiac Testing:  Ordered. Patient did not perform last year.  - Labs: None ordered.  Performed by PCP at [facility].  - Routine preventative health maintenance measures emphasized: Exercise/Diet/Weight control, Alcohol/Substance use risks and Stress Management.  Advised on different methods to help improve weight loss, like identifying stressors and triggers for emotional  eating, portion control, better adherence with Weight Watchers, and also given list of OTC supplements that can aid with weight loss. Encouraged exercise. Is already doing behavioral modifications including stairs instead of elevators, walks constantly around at work. Advised that if she still struggles with weight loss after trying these measures, can return to discuss other options.  - Menopausal, no longer having vasomotor symptoms. Continue on Calcium and Vitamin D supplementation to decrease risk of osteoporosis.  - Vaginal atrophy mild, not bothersome to patient.  -  Flu vaccine up to date.  - Completed COVID vaccination series.   - Return to [place], MD [facility]

## 2021-04-16 IMAGING — MG MM DIGITAL SCREENING BILAT W/ TOMO W/ CAD
8 series · 8 of 24 positions shown · non-contrast
Comparison: Previous exam(s).

CLINICAL DATA: Screening.

EXAM:
DIGITAL SCREENING BILATERAL MAMMOGRAM WITH TOMO AND CAD

[R MLO synth-2D]
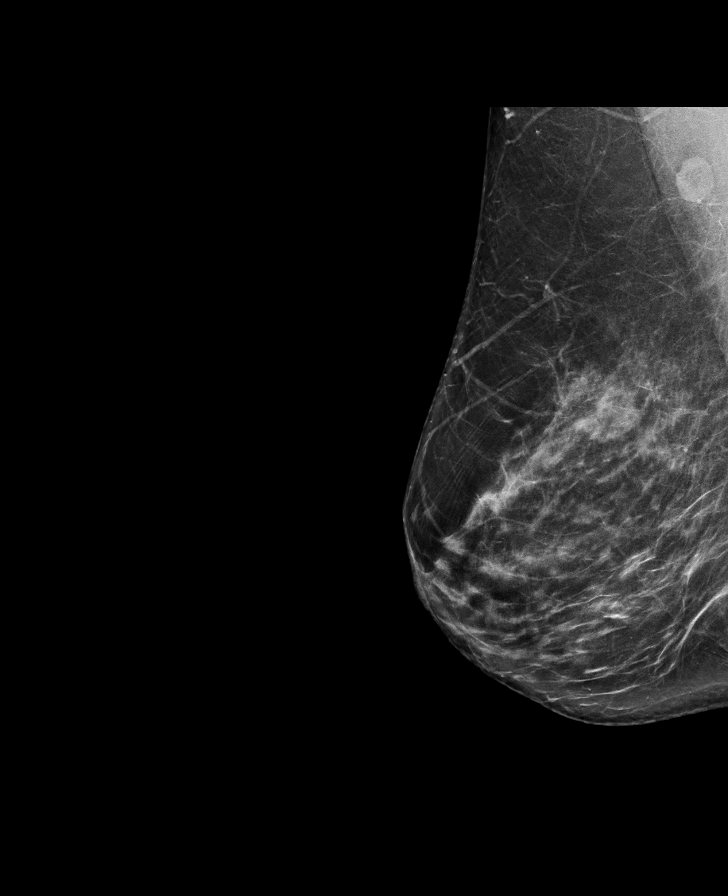

[L MLO synth-2D]
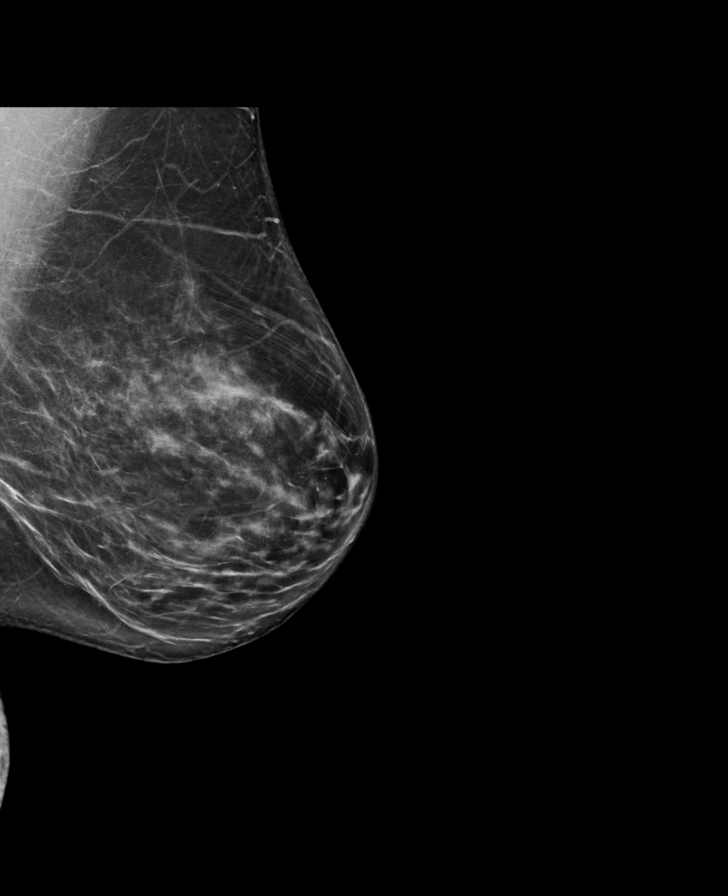

[R CC synth-2D]
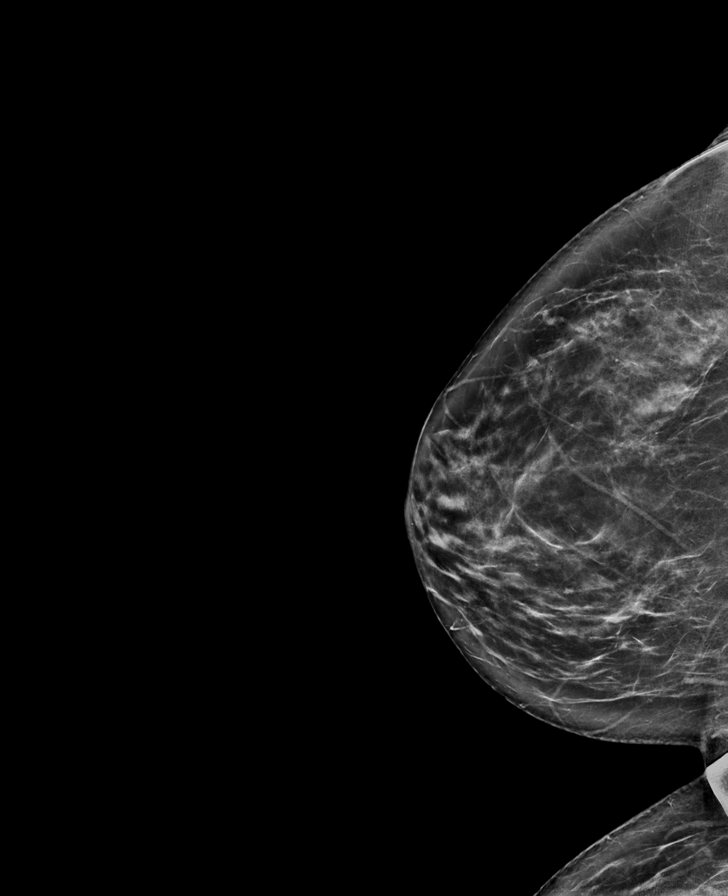

[L CC synth-2D]
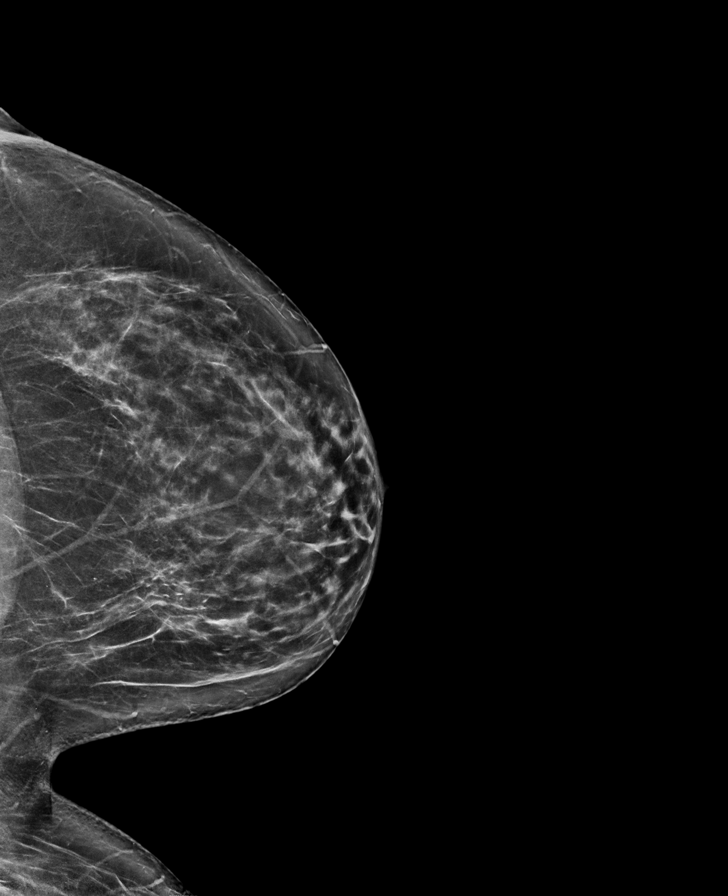

[L CC tomo · tomo slice 37/73.0]
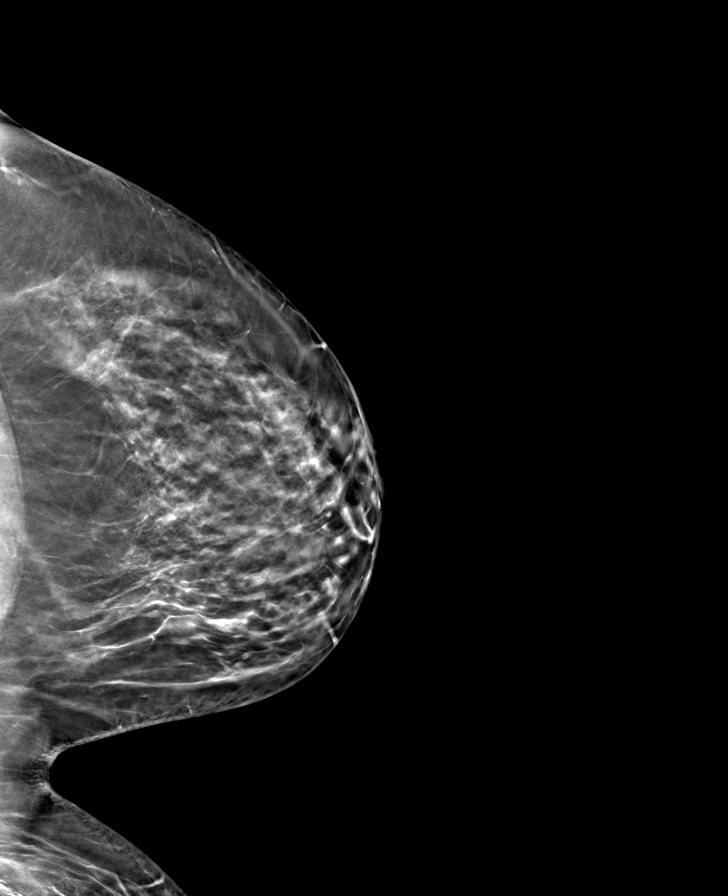

[R MLO tomo · tomo slice 43/84.0]
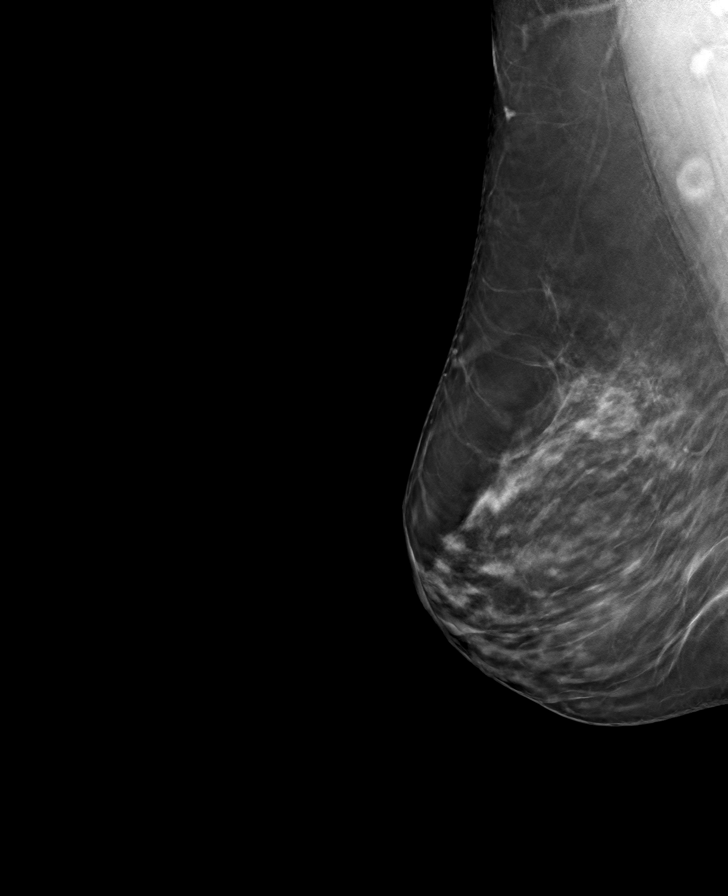

[R CC tomo · tomo slice 39/76.0]
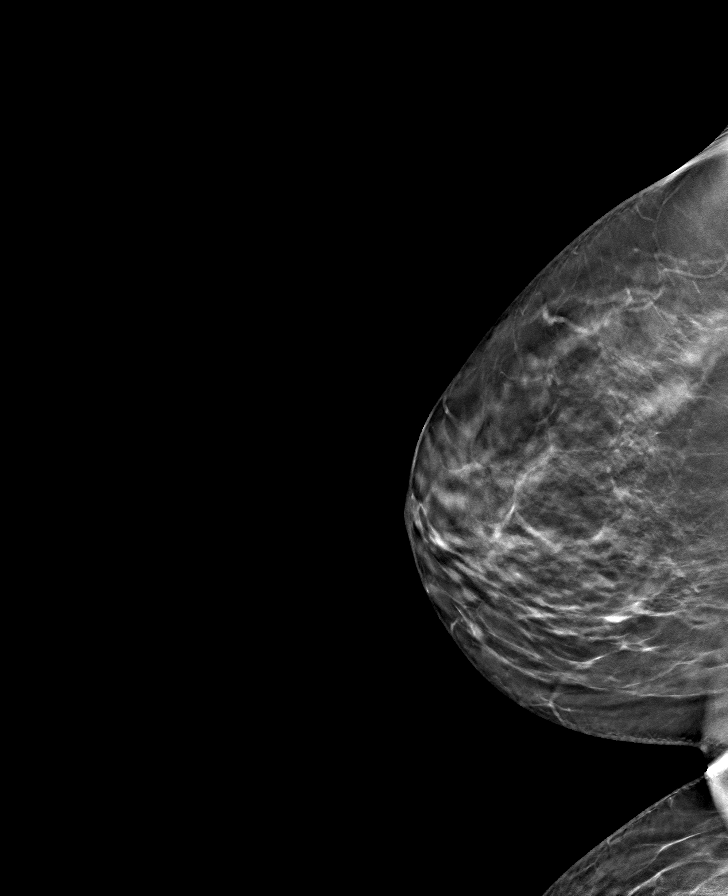

[L MLO tomo · tomo slice 43/84.0]
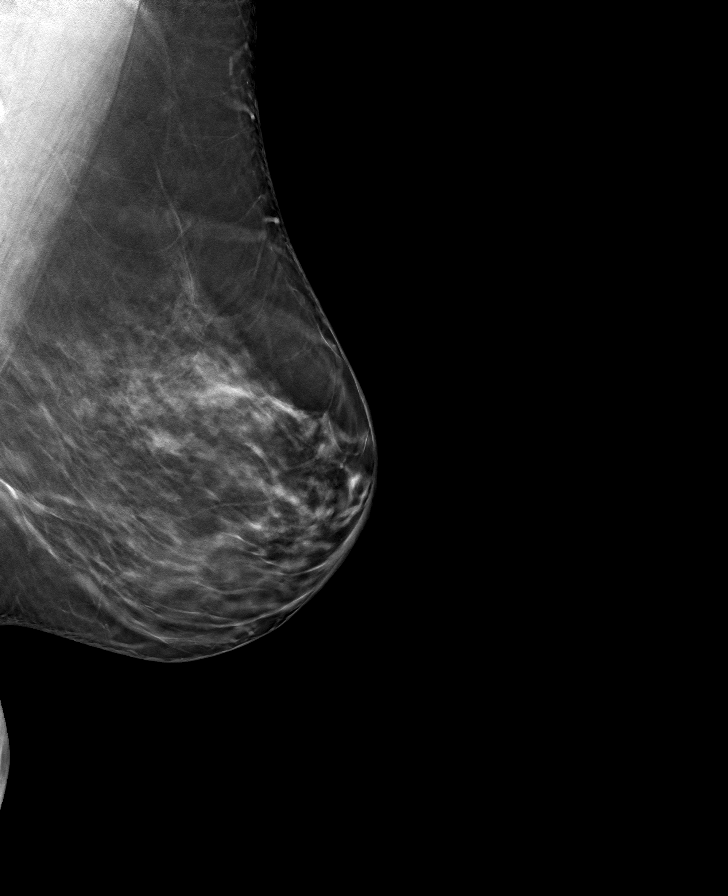

[8 of 24 positions shown; findings below may reference images not displayed]

ACR Breast Density Category c: The breast tissue is heterogeneously
dense, which may obscure small masses.
FINDINGS: There are no findings suspicious for malignancy. Images were
processed with CAD.
IMPRESSION: No mammographic evidence of malignancy. A result letter of this
screening mammogram will be mailed directly to the patient.

RECOMMENDATION:
Screening mammogram in one year. (Code:FT-U-LHB)

BI-RADS CATEGORY  1: Negative.

## 2021-06-01 ENCOUNTER — Ambulatory Visit: Payer: BC Managed Care – PPO | Admitting: Orthopaedic Surgery

## 2021-06-10 ENCOUNTER — Ambulatory Visit: Payer: BC Managed Care – PPO | Admitting: Orthopaedic Surgery

## 2021-07-14 ENCOUNTER — Ambulatory Visit: Payer: Self-pay

## 2021-07-14 ENCOUNTER — Ambulatory Visit: Payer: BC Managed Care – PPO | Admitting: Orthopaedic Surgery

## 2021-07-14 ENCOUNTER — Encounter: Payer: Self-pay | Admitting: Orthopaedic Surgery

## 2021-07-14 ENCOUNTER — Other Ambulatory Visit: Payer: Self-pay

## 2021-07-14 VITALS — Ht 67.0 in | Wt 193.0 lb

## 2021-07-14 DIAGNOSIS — M25512 Pain in left shoulder: Secondary | ICD-10-CM | POA: Diagnosis not present

## 2021-07-14 DIAGNOSIS — M7542 Impingement syndrome of left shoulder: Secondary | ICD-10-CM | POA: Insufficient documentation

## 2021-07-14 DIAGNOSIS — G8929 Other chronic pain: Secondary | ICD-10-CM | POA: Diagnosis not present

## 2021-07-14 NOTE — Progress Notes (Signed)
Office Visit Note   Patient: Emma Hall           Date of Birth: 1966-07-16           MRN: [id number] Visit Date: 07/14/2021              Requested by: Caren Macadam, MD [place],  [place] [id number] PCP: Caren Macadam, MD   Assessment & Plan: Visit Diagnoses:  1. Chronic left shoulder pain   2. Impingement syndrome of left shoulder     Plan: Emma Hall has been experiencing left shoulder pain over a period of 7 to 8 months.  She has had pain along the anterior and posterior aspect of her shoulder and has difficulty lying on that side.  On occasion she will have a little numbness or tingling but is not specifically having a problem with her neck.  She has been evaluated by an orthopedist in [place] who placed her on anti-inflammatory medicines and no dose of prednisone.  She notes it really did not make much of a difference.  She is having difficult time raising her arm over her head.  Have adhesive capsulitis or evidence of instability and she is tender directly over the distal clavicle where she has the erosive changes.  I suspect that at least part of her problem but she might have a rotator cuff tear.  Speeds sign was negative so I think her biceps tendon is probably okay.  Nonetheless, considering the chronicity of her problem I think it is worth obtaining an MRI scan.  She agrees so we will set this up and see her back shortly thereafter  Follow-Up Instructions: Return After MRI scan left shoulder.   Orders:  Orders Placed This Encounter  Procedures   XR Shoulder Left   MR Shoulder Left w/o contrast   No orders of the defined types were placed in this encounter.     Procedures: No procedures performed   Clinical Data: No additional findings.   Subjective: Chief Complaint  Patient presents with   Left Shoulder - Pain  Patient presents today for left shoulder pain. She said that it has been hurting for 7-6month. No known injury. She said  that it hurts anteriorly and posteriorly, with radiation into her neck. She does have some numbness and tingling. Decreased range of motion. She cannot lay on her left side. She is taking Tylenol for pain.  [place]  She continues to have pain despite months of treatment  HPI  Review of Systems   Objective: Vital Signs: Ht '5\' 7"'$  (1.702 m)   Wt 193 lb (87.5 kg)   BMI 30.23 kg/m   Physical Exam Constitutional:      Appearance: She is well-developed.  Eyes:     Pupils: Pupils are equal, round, and reactive to light.  Pulmonary:     Effort: Pulmonary effort is normal.  Skin:    General: Skin is warm and dry.  Neurological:     Mental Status: She is alert and oriented to person, place, and time.  Psychiatric:        Behavior: Behavior normal.    Ortho Exam left shoulder with biceps intact.  There is distinct pain over the distal clavicle.  Skin intact.  Positive impingement extreme of external rotation.  Empty can testing is negative.  Speeds sign is negative.  Able to place her arm fully over her  head but with a circuitous arc of motion.  Good grip and release.  Neurologically intact.  No pain with range of motion of cervical spine  Specialty Comments:  No specialty comments available.  Imaging: XR Shoulder Left  Result Date: 07/14/2021 Films of the left shoulder obtained in several projections.  There are some erosive changes of the distal clavicle with inferiorly directed spurring at the very tip.  This certainly could predispose her to impingement.  Some narrowing of the [facility] joint consistent with arthritis.  Humeral head is centered about the glenoid.  Normal space between the humeral head and the acromion.  No acute changes.  Some sclerosis about the greater tuberosity    PMFS History: Patient Active Problem List   Diagnosis Date Noted   Impingement syndrome of left shoulder 07/14/2021   Bilateral  wrist pain 01/13/2021   Palpitations 01/30/2020   Irregular heart beat 01/30/2020   Snoring 01/30/2020   Unilateral primary osteoarthritis, right knee 01/29/2020   Unilateral primary osteoarthritis, right hip 01/29/2020   Low back pain 09/05/2019   DDD (degenerative disc disease), lumbosacral 09/05/2019   AVN (avascular necrosis of bone) ([place]) 09/05/2019   Baker cyst, right 09/05/2019   Pain in right foot 01/16/2019   Pain in right hand 01/16/2019   Overweight (BMI 25.0-29.9) 12/14/2017   Post-menopause on HRT (hormone replacement therapy) 12/14/2017   Past Medical History:  Diagnosis Date   Anemia    Arthritis    right knee right hip lower back   Endometriosis    Fibroid    Heart palpitations    Vitamin D deficiency     Family History  Problem Relation Age of Onset   Healthy Mother    Healthy Father    Breast cancer Maternal Aunt 48   Breast cancer Maternal Aunt 49   Breast cancer Maternal Aunt 50   Breast cancer Sister    Stroke Sister     Past Surgical History:  Procedure Laterality Date   ABDOMINAL HYSTERECTOMY     COLONOSCOPY WITH PROPOFOL N/A 03/09/2020   Procedure: COLONOSCOPY WITH PROPOFOL;  Surgeon: Jonathon Bellows, MD;  Location: [facility] ENDOSCOPY;  Service: Gastroenterology;  Laterality: N/A;   GALLBLADDER SURGERY     Social History   Occupational History   Not on file  Tobacco Use   Smoking status: Never   Smokeless tobacco: Never  Vaping Use   Vaping Use: Never used  Substance and Sexual Activity   Alcohol use: Not Currently    Comment: occass   Drug use: No   Sexual activity: Not Currently    Birth control/protection: Surgical

## 2021-07-22 ENCOUNTER — Other Ambulatory Visit: Payer: Self-pay

## 2021-07-22 ENCOUNTER — Ambulatory Visit
Admission: RE | Admit: 2021-07-22 | Discharge: 2021-07-22 | Disposition: A | Discharge status: Home / Self Care | Payer: BC Managed Care – PPO | Source: Ambulatory Visit | Attending: Orthopaedic Surgery | Admitting: Orthopaedic Surgery

## 2021-07-22 DIAGNOSIS — M25512 Pain in left shoulder: Secondary | ICD-10-CM | POA: Diagnosis present

## 2021-07-22 DIAGNOSIS — G8929 Other chronic pain: Secondary | ICD-10-CM | POA: Diagnosis present

## 2021-07-28 ENCOUNTER — Ambulatory Visit: Payer: BC Managed Care – PPO | Admitting: Orthopaedic Surgery

## 2021-07-28 ENCOUNTER — Encounter: Payer: Self-pay | Admitting: Orthopaedic Surgery

## 2021-07-28 ENCOUNTER — Other Ambulatory Visit: Payer: Self-pay

## 2021-07-28 DIAGNOSIS — M7542 Impingement syndrome of left shoulder: Secondary | ICD-10-CM | POA: Diagnosis not present

## 2021-07-28 NOTE — Progress Notes (Signed)
Office Visit Note   Patient: Emma Hall           Date of Birth: 05-Apr-1966           MRN: [id number] Visit Date: 07/28/2021              Requested by: Caren Macadam, MD [place],  [place] [id number] PCP: Caren Macadam, MD   Assessment & Plan: Visit Diagnoses:  1. Impingement syndrome of left shoulder     Plan: Ms. Drumgoole had an MRI scan of her left shoulder demonstrating severe arthropathic changes with joint erosion and effusion associated with marrow edema surrounding the inflammation of the [facility] joint.  She has a type I-II acromion but no lateral downsloping.  Very mild degenerative changes in the glenohumeral joint without effusion or synovitis.  No labral tears.  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  Long discussion regarding all the above with Mrs. Goetting.  She seems to be mostly symptomatic about the [facility] joint with local tenderness and a positive crossarm test.  I discussed different treatment options but she prefer to proceed with an arthroscopic SCD and DCR based on her chronic pain.  She has been plan to see a rheumatologist sometime in the next several months as she has multiple joint complaints.  Discussed the outpatient nature of the surgery, incisions or what she may expect postoperatively.  Certainly there is no guarantee that she will have complete relief of pain I think this is a reasonable approach.  She did not want to consider therapy or cortisone injection.  She would need to plan to stay out of work at least 4 to 6 weeks  Follow-Up Instructions: Return We will schedule arthroscopic SCD DCR left shoulder.   Orders:  No orders of the defined types were placed in this encounter.  No orders of the defined types were placed in this encounter.     Procedures: No procedures performed   Clinical Data: No additional findings.   Subjective: Chief Complaint  Patient  presents with   Left Shoulder - Pain, Follow-up    MRI REVIEW-LEFT SHOULDER  Chronic pain left shoulder with difficulty with overhead motion.  No change since last evaluation  HPI  Review of Systems   Objective: Vital Signs: There were no vitals taken for this visit.  Physical Exam Constitutional:      Appearance: She is well-developed.  Eyes:     Pupils: Pupils are equal, round, and reactive to light.  Pulmonary:     Effort: Pulmonary effort is normal.  Skin:    General: Skin is warm and dry.  Neurological:     Mental Status: She is alert and oriented to person, place, and time.  Psychiatric:        Behavior: Behavior normal.    Ortho Exam awake alert and oriented x3.  Comfortable sitting.  Left shoulder with local tenderness at the [facility] joint.  Minimally positive impingement testing.  Negative Speed sign.  No obvious loss of motion.  Full overhead flexion but with a circuitous arc of motion.  Skin intact.  Good grip and release.  Biceps intact  Specialty Comments:  No specialty comments available.  Imaging: No results found.   PMFS History: Patient Active Problem List   Diagnosis Date Noted   Impingement syndrome of left shoulder 07/14/2021   Bilateral wrist pain 01/13/2021   Palpitations 01/30/2020   Irregular heart beat 01/30/2020  Snoring 01/30/2020   Unilateral primary osteoarthritis, right knee 01/29/2020   Unilateral primary osteoarthritis, right hip 01/29/2020   Low back pain 09/05/2019   DDD (degenerative disc disease), lumbosacral 09/05/2019   AVN (avascular necrosis of bone) ([place]) 09/05/2019   Baker cyst, right 09/05/2019   Pain in right foot 01/16/2019   Pain in right hand 01/16/2019   Overweight (BMI 25.0-29.9) 12/14/2017   Post-menopause on HRT (hormone replacement therapy) 12/14/2017   Past Medical History:  Diagnosis Date   Anemia    Arthritis    right knee right hip lower back   Endometriosis    Fibroid    Heart palpitations    Vitamin  D deficiency     Family History  Problem Relation Age of Onset   Healthy Mother    Healthy Father    Breast cancer Maternal Aunt 48   Breast cancer Maternal Aunt 49   Breast cancer Maternal Aunt 50   Breast cancer Sister    Stroke Sister     Past Surgical History:  Procedure Laterality Date   ABDOMINAL HYSTERECTOMY     COLONOSCOPY WITH PROPOFOL N/A 03/09/2020   Procedure: COLONOSCOPY WITH PROPOFOL;  Surgeon: Jonathon Bellows, MD;  Location: [facility] ENDOSCOPY;  Service: Gastroenterology;  Laterality: N/A;   GALLBLADDER SURGERY     Social History   Occupational History   Not on file  Tobacco Use   Smoking status: Never   Smokeless tobacco: Never  Vaping Use   Vaping Use: Never used  Substance and Sexual Activity   Alcohol use: Not Currently    Comment: occass   Drug use: No   Sexual activity: Not Currently    Birth control/protection: Surgical     Garald Balding, MD   Note - This record has been created using [employer].  Chart creation errors have been sought, but may not always  have been located. Such creation errors do not reflect on  the standard of medical care.

## 2021-08-05 ENCOUNTER — Other Ambulatory Visit: Payer: Self-pay | Admitting: Orthopaedic Surgery

## 2021-08-05 ENCOUNTER — Encounter: Payer: Self-pay | Admitting: Orthopaedic Surgery

## 2021-08-05 DIAGNOSIS — M19012 Primary osteoarthritis, left shoulder: Secondary | ICD-10-CM | POA: Diagnosis not present

## 2021-08-05 DIAGNOSIS — M7542 Impingement syndrome of left shoulder: Secondary | ICD-10-CM | POA: Diagnosis not present

## 2021-08-05 MED ORDER — OXYCODONE-ACETAMINOPHEN 5-325 MG PO TABS: 1.0000 | ORAL_TABLET | Freq: Four times a day (QID) | ORAL | 0 refills | Status: AC | PRN

## 2021-08-11 ENCOUNTER — Other Ambulatory Visit: Payer: Self-pay

## 2021-08-11 ENCOUNTER — Encounter: Payer: Self-pay | Admitting: Orthopaedic Surgery

## 2021-08-11 ENCOUNTER — Ambulatory Visit (INDEPENDENT_AMBULATORY_CARE_PROVIDER_SITE_OTHER): Payer: BC Managed Care – PPO | Admitting: Orthopaedic Surgery

## 2021-08-11 DIAGNOSIS — M7542 Impingement syndrome of left shoulder: Secondary | ICD-10-CM

## 2021-08-11 NOTE — Progress Notes (Signed)
Office Visit Note   Patient: Emma Hall           Date of Birth: 04-23-1966           MRN: [id number] Visit Date: 08/11/2021              Requested by: Caren Macadam, MD [place],  [place] [id number] PCP: Caren Macadam, MD   Assessment & Plan: Visit Diagnoses:  1. Impingement syndrome of left shoulder     Plan: Ms. Goga is 6 days status post left shoulder arthroscopic subacromial decompression distal clavicle resection.  The MRI scan demonstrated significant degenerative change and bony edema within the distal clavicle and acromion.  She is doing well.  The 3 arthroscopic portals are healing without problem.  I remove the stitch from the anterior and lateral portals.  She will begin range of motion exercises out of the sling and return to see me in 2 weeks.  We will hold on therapy until she returns  Follow-Up Instructions: Return in about 2 weeks (around 08/25/2021).   Orders:  No orders of the defined types were placed in this encounter.  No orders of the defined types were placed in this encounter.     Procedures: No procedures performed   Clinical Data: No additional findings.   Subjective: Chief Complaint  Patient presents with   Left Shoulder - Follow-up    Left shoulder arthroscopy 08/05/2021  Patient presents today for follow up on her left shoulder. She had a left shoulder arthroscopy on 08/05/2021. She is now 6 days out from surgery. She states that she is doing well, but her left arm feels weak.   HPI  Review of Systems   Objective: Vital Signs: There were no vitals taken for this visit.  Physical Exam  Ortho Exam left shoulder incisions healing without problem.  Anterior lateral arthroscopic portal wounds look just fine there is a little excoriation of the lateral portal but no evidence of infection.  Stitches removed.  She can abduct about 80 degrees and flex about 90 degrees.  Neurologically intact  Specialty Comments:  No  specialty comments available.  Imaging: No results found.   PMFS History: Patient Active Problem List   Diagnosis Date Noted   Impingement syndrome of left shoulder 07/14/2021   Bilateral wrist pain 01/13/2021   Palpitations 01/30/2020   Irregular heart beat 01/30/2020   Snoring 01/30/2020   Unilateral primary osteoarthritis, right knee 01/29/2020   Unilateral primary osteoarthritis, right hip 01/29/2020   Low back pain 09/05/2019   DDD (degenerative disc disease), lumbosacral 09/05/2019   AVN (avascular necrosis of bone) ([place]) 09/05/2019   Baker cyst, right 09/05/2019   Pain in right foot 01/16/2019   Pain in right hand 01/16/2019   Overweight (BMI 25.0-29.9) 12/14/2017   Post-menopause on HRT (hormone replacement therapy) 12/14/2017   Past Medical History:  Diagnosis Date   Anemia    Arthritis    right knee right hip lower back   Endometriosis    Fibroid    Heart palpitations    Vitamin D deficiency     Family History  Problem Relation Age of Onset   Healthy Mother    Healthy Father    Breast cancer Maternal Aunt 48   Breast cancer Maternal Aunt 49   Breast cancer Maternal Aunt 50   Breast cancer Sister    Stroke Sister     Past Surgical History:  Procedure Laterality Date   ABDOMINAL HYSTERECTOMY  COLONOSCOPY WITH PROPOFOL N/A 03/09/2020   Procedure: COLONOSCOPY WITH PROPOFOL;  Surgeon: Jonathon Bellows, MD;  Location: [facility] ENDOSCOPY;  Service: Gastroenterology;  Laterality: N/A;   GALLBLADDER SURGERY     Social History   Occupational History   Not on file  Tobacco Use   Smoking status: Never   Smokeless tobacco: Never  Vaping Use   Vaping Use: Never used  Substance and Sexual Activity   Alcohol use: Not Currently    Comment: occass   Drug use: No   Sexual activity: Not Currently    Birth control/protection: Surgical

## 2021-08-12 ENCOUNTER — Encounter: Payer: BC Managed Care – PPO | Admitting: Orthopaedic Surgery

## 2021-08-17 ENCOUNTER — Other Ambulatory Visit: Payer: Self-pay | Admitting: Obstetrics and Gynecology

## 2021-08-17 DIAGNOSIS — Z1231 Encounter for screening mammogram for malignant neoplasm of breast: Secondary | ICD-10-CM

## 2021-08-18 DIAGNOSIS — Z1231 Encounter for screening mammogram for malignant neoplasm of breast: Secondary | ICD-10-CM

## 2021-08-25 ENCOUNTER — Other Ambulatory Visit: Payer: Self-pay

## 2021-08-25 ENCOUNTER — Ambulatory Visit (INDEPENDENT_AMBULATORY_CARE_PROVIDER_SITE_OTHER): Payer: BC Managed Care – PPO | Admitting: Orthopaedic Surgery

## 2021-08-25 ENCOUNTER — Encounter: Payer: Self-pay | Admitting: Orthopaedic Surgery

## 2021-08-25 DIAGNOSIS — M7542 Impingement syndrome of left shoulder: Secondary | ICD-10-CM

## 2021-08-25 NOTE — Progress Notes (Signed)
Office Visit Note   Patient: Emma Hall           Date of Birth: 1966-02-28           MRN: [id number] Visit Date: 08/25/2021              Requested by: Caren Macadam, MD [place],  [place] [address] PCP: Caren Macadam, MD   Assessment & Plan: Visit Diagnoses: No diagnosis found.  Plan: 3 weeks status post left shoulder SAD distal clavicle excision doing well. She is much improved than prior to surgery. She may return to activities as tolerated. Should continue range of motion of her left shoulder. Advised some soft tissue massage of her surgical portals with vitamin E oil . Follow up for a final visit in 3 weeks  Follow-Up Instructions: No follow-ups on file.   Orders:  No orders of the defined types were placed in this encounter.  No orders of the defined types were placed in this encounter.     Procedures: No procedures performed   Clinical Data: No additional findings.   Subjective: No chief complaint on file. Patient presents today for a two week follow up on her left shoulder. She had a left shoulder arthroscopy on 08/05/2021. She is almost 3 weeks out from surgery. She is doing well and much improved.    Review of Systems  All other systems reviewed and are negative.   Objective: Vital Signs: There were no vitals taken for this visit.  Physical Exam Constitutional:      Appearance: Normal appearance.  Skin:    General: Skin is warm and dry.  Neurological:     Mental Status: She is alert.    Ortho Exam Examination of her left shoulder demonstrates well-healed surgical portals.  She has no erythema no swelling.  She has full range of motion with forward flexion internal rotation external rotation without any pain.  Strength with external and internal rotation is good .  She has no impingement findings today. Specialty Comments:  No specialty comments available.  Imaging: No results found.   PMFS History: Patient Active  Problem List   Diagnosis Date Noted   Impingement syndrome of left shoulder 07/14/2021   Bilateral wrist pain 01/13/2021   Palpitations 01/30/2020   Irregular heart beat 01/30/2020   Snoring 01/30/2020   Unilateral primary osteoarthritis, right knee 01/29/2020   Unilateral primary osteoarthritis, right hip 01/29/2020   Low back pain 09/05/2019   DDD (degenerative disc disease), lumbosacral 09/05/2019   AVN (avascular necrosis of bone) ([place]) 09/05/2019   Baker cyst, right 09/05/2019   Pain in right foot 01/16/2019   Pain in right hand 01/16/2019   Overweight (BMI 25.0-29.9) 12/14/2017   Post-menopause on HRT (hormone replacement therapy) 12/14/2017   Past Medical History:  Diagnosis Date   Anemia    Arthritis    right knee right hip lower back   Endometriosis    Fibroid    Heart palpitations    Vitamin D deficiency     Family History  Problem Relation Age of Onset   Healthy Mother    Healthy Father    Breast cancer Maternal Aunt 48   Breast cancer Maternal Aunt 49   Breast cancer Maternal Aunt 50   Breast cancer Sister    Stroke Sister     Past Surgical History:  Procedure Laterality Date   ABDOMINAL HYSTERECTOMY     COLONOSCOPY WITH PROPOFOL N/A 03/09/2020   Procedure: COLONOSCOPY WITH  PROPOFOL;  Surgeon: Jonathon Bellows, MD;  Location: [facility] ENDOSCOPY;  Service: Gastroenterology;  Laterality: N/A;   GALLBLADDER SURGERY     Social History   Occupational History   Not on file  Tobacco Use   Smoking status: Never   Smokeless tobacco: Never  Vaping Use   Vaping Use: Never used  Substance and Sexual Activity   Alcohol use: Not Currently    Comment: occass   Drug use: No   Sexual activity: Not Currently    Birth control/protection: Surgical

## 2021-09-16 ENCOUNTER — Encounter: Payer: Self-pay | Admitting: Orthopaedic Surgery

## 2021-09-16 ENCOUNTER — Other Ambulatory Visit: Payer: Self-pay

## 2021-09-16 ENCOUNTER — Ambulatory Visit: Payer: BC Managed Care – PPO | Admitting: Orthopaedic Surgery

## 2021-09-16 DIAGNOSIS — M7542 Impingement syndrome of left shoulder: Secondary | ICD-10-CM

## 2021-09-16 NOTE — Progress Notes (Signed)
Office Visit Note   Patient: Emma Hall           Date of Birth: Apr 06, 1966           MRN: [id number] Visit Date: 09/16/2021              Requested by: Caren Macadam, MD [address],  [place] [address] PCP: Caren Macadam, MD   Assessment & Plan: Visit Diagnoses: No diagnosis found.  Plan: Patient is now 5 weeks status post subacromial decompression and excision of distal clavicle.  She actually is doing quite well.  She does have some complaints of some soreness over her deltoid.  No complaints or signs of impingement no pain over the distal clavicle and excision.  Actually doing well at this point.  She has been doing a home exercise program on her own.  We expect that some of the pain over her deltoid should get better in the next few weeks.  She will follow-up in a month.  Follow-Up Instructions: No follow-ups on file.   Orders:  No orders of the defined types were placed in this encounter.  No orders of the defined types were placed in this encounter.     Procedures: No procedures performed   Clinical Data: No additional findings.   Subjective: No chief complaint on file. Patient presents today for a 3 week follow up. She had a left shoulder arthroscopy on 08/05/2021. She states that she is doing well where the surgery was performed, but is tender at the top of her arm. She is not taking anything for pain.  HPI  Review of Systems  All other systems reviewed and are negative.   Objective: Vital Signs: There were no vitals taken for this visit.  Physical Exam Patient appears well normal respiratory effort Ortho Exam Examination of her left shoulder demonstrates well-healed surgical portals.  She has no erythema she has no swelling.  She has full active forward elevation and internal rotation.  Resisted abduction is actually quite good.  With cross arm testing she does not have any tenderness over the [facility] joint she does have a little soreness over  the again the deltoid.  Distal pulses are intact Specialty Comments:  No specialty comments available.  Imaging: No results found.   PMFS History: Patient Active Problem List   Diagnosis Date Noted   Impingement syndrome of left shoulder 07/14/2021   Bilateral wrist pain 01/13/2021   Palpitations 01/30/2020   Irregular heart beat 01/30/2020   Snoring 01/30/2020   Unilateral primary osteoarthritis, right knee 01/29/2020   Unilateral primary osteoarthritis, right hip 01/29/2020   Low back pain 09/05/2019   DDD (degenerative disc disease), lumbosacral 09/05/2019   AVN (avascular necrosis of bone) ([place]) 09/05/2019   Baker cyst, right 09/05/2019   Pain in right foot 01/16/2019   Pain in right hand 01/16/2019   Overweight (BMI 25.0-29.9) 12/14/2017   Post-menopause on HRT (hormone replacement therapy) 12/14/2017   Past Medical History:  Diagnosis Date   Anemia    Arthritis    right knee right hip lower back   Endometriosis    Fibroid    Heart palpitations    Vitamin D deficiency     Family History  Problem Relation Age of Onset   Healthy Mother    Healthy Father    Breast cancer Maternal Aunt 48   Breast cancer Maternal Aunt 49   Breast cancer Maternal Aunt 50   Breast cancer Sister  Stroke Sister     Past Surgical History:  Procedure Laterality Date   ABDOMINAL HYSTERECTOMY     COLONOSCOPY WITH PROPOFOL N/A 03/09/2020   Procedure: COLONOSCOPY WITH PROPOFOL;  Surgeon: Jonathon Bellows, MD;  Location: [facility] ENDOSCOPY;  Service: Gastroenterology;  Laterality: N/A;   GALLBLADDER SURGERY     Social History   Occupational History   Not on file  Tobacco Use   Smoking status: Never   Smokeless tobacco: Never  Vaping Use   Vaping Use: Never used  Substance and Sexual Activity   Alcohol use: Not Currently    Comment: occass   Drug use: No   Sexual activity: Not Currently    Birth control/protection: Surgical

## 2021-10-14 ENCOUNTER — Ambulatory Visit
Admission: RE | Admit: 2021-10-14 | Discharge: 2021-10-14 | Disposition: A | Discharge status: Home / Self Care | Payer: BC Managed Care – PPO | Source: Ambulatory Visit

## 2021-10-14 DIAGNOSIS — Z1231 Encounter for screening mammogram for malignant neoplasm of breast: Secondary | ICD-10-CM

## 2021-10-20 ENCOUNTER — Encounter: Payer: Self-pay | Admitting: Orthopaedic Surgery

## 2021-10-20 ENCOUNTER — Ambulatory Visit: Payer: BC Managed Care – PPO | Admitting: Orthopaedic Surgery

## 2021-10-20 ENCOUNTER — Other Ambulatory Visit: Payer: Self-pay

## 2021-10-20 DIAGNOSIS — M7542 Impingement syndrome of left shoulder: Secondary | ICD-10-CM

## 2021-10-20 NOTE — Progress Notes (Signed)
Office Visit Note   Patient: Emma Hall           Date of Birth: 1966-09-10           MRN: [id number] Visit Date: 10/20/2021              Requested by: Caren Macadam, MD [address] [place],  [place] [address] PCP: Caren Macadam, MD   Assessment & Plan: Visit Diagnoses: No diagnosis found.  Plan: Patient is now 2-1/58-month status post left shoulder arthroscopy with subacromial decompression and excision of distal clavicle she is doing very well and has returned to her activities.  She has been doing exercises on her own.  She only very occasionally gets some aching in the shoulder.  She feels the surgery has been quite successful.  She will follow-up as needed  Follow-Up Instructions: No follow-ups on file.   Orders:  No orders of the defined types were placed in this encounter.  No orders of the defined types were placed in this encounter.     Procedures: No procedures performed   Clinical Data: No additional findings.   Subjective: Chief Complaint  Patient presents with   Left Shoulder - Pain  Patient presents today for follow up on her left shoulder. She had a left shoulder arthroscopy on 08/05/2021. She said that she still has some pain superiorly, but overall is doing much better. Not taking anything for pain.     Review of Systems  All other systems reviewed and are negative.   Objective: Vital Signs: There were no vitals taken for this visit.    Ortho Exam Patient appears well and is sitting comfortably in a chair.  Normal respiratory effort.  She has well-healed surgical portals.  She has fluid forward elevation external and internal rotation.  Excellent strength with resisted abduction external and internal rotation. Specialty Comments:  No specialty comments available.  Imaging: No results found.   PMFS History: Patient Active Problem List   Diagnosis Date Noted   Impingement syndrome of left shoulder 07/14/2021   Bilateral wrist  pain 01/13/2021   Palpitations 01/30/2020   Irregular heart beat 01/30/2020   Snoring 01/30/2020   Unilateral primary osteoarthritis, right knee 01/29/2020   Unilateral primary osteoarthritis, right hip 01/29/2020   Low back pain 09/05/2019   DDD (degenerative disc disease), lumbosacral 09/05/2019   AVN (avascular necrosis of bone) ([place]) 09/05/2019   Baker cyst, right 09/05/2019   Pain in right foot 01/16/2019   Pain in right hand 01/16/2019   Overweight (BMI 25.0-29.9) 12/14/2017   Post-menopause on HRT (hormone replacement therapy) 12/14/2017   Past Medical History:  Diagnosis Date   Anemia    Arthritis    right knee right hip lower back   Endometriosis    Fibroid    Heart palpitations    Vitamin D deficiency     Family History  Problem Relation Age of Onset   Healthy Mother    Healthy Father    Breast cancer Maternal Aunt 48   Breast cancer Maternal Aunt 49   Breast cancer Maternal Aunt 50   Breast cancer Sister    Stroke Sister     Past Surgical History:  Procedure Laterality Date   ABDOMINAL HYSTERECTOMY     COLONOSCOPY WITH PROPOFOL N/A 03/09/2020   Procedure: COLONOSCOPY WITH PROPOFOL;  Surgeon: Jonathon Bellows, MD;  Location[facility] ENDOSCOPY;  Service: Gastroenterology;  Laterality: N/A;   GALLBLADDER SURGERY     Social History   Occupational  History   Not on file  Tobacco Use   Smoking status: Never   Smokeless tobacco: Never  Vaping Use   Vaping Use: Never used  Substance and Sexual Activity   Alcohol use: Not Currently    Comment: occass   Drug use: No   Sexual activity: Not Currently    Birth control/protection: Surgical

## 2022-11-15 ENCOUNTER — Other Ambulatory Visit: Payer: Self-pay | Admitting: Family Medicine

## 2022-11-15 DIAGNOSIS — Z1231 Encounter for screening mammogram for malignant neoplasm of breast: Secondary | ICD-10-CM

## 2022-11-16 ENCOUNTER — Ambulatory Visit
Admission: RE | Admit: 2022-11-16 | Discharge: 2022-11-16 | Disposition: A | Discharge status: Home / Self Care | Payer: BC Managed Care – PPO | Source: Ambulatory Visit | Attending: Family Medicine | Admitting: Family Medicine

## 2022-11-16 DIAGNOSIS — Z1231 Encounter for screening mammogram for malignant neoplasm of breast: Secondary | ICD-10-CM

## 2023-09-30 ENCOUNTER — Emergency Department: Payer: BC Managed Care – PPO

## 2023-09-30 ENCOUNTER — Inpatient Hospital Stay
Admission: EM | Admit: 2023-09-30 | Discharge: 2023-10-03 | DRG: 153 | Disposition: A | Discharge status: Home / Self Care | Payer: BC Managed Care – PPO | Source: Ambulatory Visit | Attending: Internal Medicine | Admitting: Internal Medicine

## 2023-09-30 ENCOUNTER — Other Ambulatory Visit: Payer: Self-pay

## 2023-09-30 DIAGNOSIS — Z1152 Encounter for screening for COVID-19: Secondary | ICD-10-CM

## 2023-09-30 DIAGNOSIS — R0981 Nasal congestion: Secondary | ICD-10-CM | POA: Diagnosis present

## 2023-09-30 DIAGNOSIS — Z6831 Body mass index (BMI) 31.0-31.9, adult: Secondary | ICD-10-CM | POA: Diagnosis not present

## 2023-09-30 DIAGNOSIS — M879 Osteonecrosis, unspecified: Secondary | ICD-10-CM | POA: Diagnosis present

## 2023-09-30 DIAGNOSIS — M51379 Other intervertebral disc degeneration, lumbosacral region without mention of lumbar back pain or lower extremity pain: Secondary | ICD-10-CM | POA: Diagnosis present

## 2023-09-30 DIAGNOSIS — F32A Depression, unspecified: Secondary | ICD-10-CM | POA: Diagnosis present

## 2023-09-30 DIAGNOSIS — E559 Vitamin D deficiency, unspecified: Secondary | ICD-10-CM | POA: Diagnosis present

## 2023-09-30 DIAGNOSIS — R509 Fever, unspecified: Secondary | ICD-10-CM | POA: Diagnosis present

## 2023-09-30 DIAGNOSIS — Z823 Family history of stroke: Secondary | ICD-10-CM

## 2023-09-30 DIAGNOSIS — E669 Obesity, unspecified: Secondary | ICD-10-CM | POA: Diagnosis present

## 2023-09-30 DIAGNOSIS — H6691 Otitis media, unspecified, right ear: Principal | ICD-10-CM | POA: Diagnosis present

## 2023-09-30 DIAGNOSIS — Z803 Family history of malignant neoplasm of breast: Secondary | ICD-10-CM

## 2023-09-30 DIAGNOSIS — I4719 Other supraventricular tachycardia: Secondary | ICD-10-CM | POA: Diagnosis present

## 2023-09-30 DIAGNOSIS — Z90710 Acquired absence of both cervix and uterus: Secondary | ICD-10-CM | POA: Diagnosis not present

## 2023-09-30 DIAGNOSIS — D72829 Elevated white blood cell count, unspecified: Secondary | ICD-10-CM | POA: Insufficient documentation

## 2023-09-30 DIAGNOSIS — Z79899 Other long term (current) drug therapy: Secondary | ICD-10-CM

## 2023-09-30 DIAGNOSIS — I499 Cardiac arrhythmia, unspecified: Secondary | ICD-10-CM | POA: Diagnosis present

## 2023-09-30 DIAGNOSIS — R519 Headache, unspecified: Secondary | ICD-10-CM | POA: Diagnosis present

## 2023-09-30 DIAGNOSIS — G2581 Restless legs syndrome: Secondary | ICD-10-CM | POA: Diagnosis present

## 2023-09-30 DIAGNOSIS — E65 Localized adiposity: Secondary | ICD-10-CM | POA: Insufficient documentation

## 2023-09-30 DIAGNOSIS — A419 Sepsis, unspecified organism: Principal | ICD-10-CM

## 2023-09-30 LAB — COMPREHENSIVE METABOLIC PANEL
ALT: 15 U/L (ref 0–44)
AST: 16 U/L (ref 15–41)
Albumin: 4.3 g/dL (ref 3.5–5.0)
Alkaline Phosphatase: 87 U/L (ref 38–126)
Anion gap: 10 (ref 5–15)
BUN: 11 mg/dL (ref 6–20)
CO2: 26 mmol/L (ref 22–32)
Calcium: 9.2 mg/dL (ref 8.9–10.3)
Chloride: 101 mmol/L (ref 98–111)
Creatinine, Ser: 0.96 mg/dL (ref 0.44–1.00)
GFR, Estimated: >60 mL/min (ref >60)
Glucose, Bld: 110 mg/dL — ABNORMAL HIGH (ref 70–99)
Potassium: 3.7 mmol/L (ref 3.5–5.1)
Sodium: 137 mmol/L (ref 135–145)
Total Bilirubin: 0.6 mg/dL (ref <1.2)
Total Protein: 8 g/dL (ref 6.5–8.1)

## 2023-09-30 LAB — URINALYSIS, W/ REFLEX TO CULTURE (INFECTION SUSPECTED)
Bacteria, UA: NONE SEEN
Bilirubin Urine: NEGATIVE
Glucose, UA: NEGATIVE mg/dL
Hgb urine dipstick: NEGATIVE
Ketones, ur: NEGATIVE mg/dL
Leukocytes,Ua: NEGATIVE
Nitrite: NEGATIVE
Protein, ur: NEGATIVE mg/dL
Specific Gravity, Urine: 1.005 (ref 1.005–1.030)
pH: 7 (ref 5.0–8.0)

## 2023-09-30 LAB — CBC WITH DIFFERENTIAL/PLATELET
Abs Immature Granulocytes: 0.04 10*3/uL (ref 0.00–0.07)
Basophils Absolute: 0.1 10*3/uL (ref 0.0–0.1)
Basophils Relative: 0 %
Eosinophils Absolute: 0 10*3/uL (ref 0.0–0.5)
Eosinophils Relative: 0 %
HCT: 37.5 % (ref 36.0–46.0)
Hemoglobin: 12.2 g/dL (ref 12.0–15.0)
Immature Granulocytes: 0 %
Lymphocytes Relative: 16 %
Lymphs Abs: 2.2 10*3/uL (ref 0.7–4.0)
MCH: 27.5 pg (ref 26.0–34.0)
MCHC: 32.5 g/dL (ref 30.0–36.0)
MCV: 84.7 fL (ref 80.0–100.0)
Monocytes Absolute: 0.7 10*3/uL (ref 0.1–1.0)
Monocytes Relative: 5 %
Neutro Abs: 10.7 10*3/uL — ABNORMAL HIGH (ref 1.7–7.7)
Neutrophils Relative %: 79 %
Platelets: 389 10*3/uL (ref 150–400)
RBC: 4.43 MIL/uL (ref 3.87–5.11)
RDW: 14 % (ref 11.5–15.5)
WBC: 13.7 10*3/uL — ABNORMAL HIGH (ref 4.0–10.5)
nRBC: 0 % (ref 0.0–0.2)

## 2023-09-30 LAB — T4, FREE: Free T4: 0.8 ng/dL (ref 0.61–1.12)

## 2023-09-30 LAB — PROTIME-INR
INR: 1.1 (ref 0.8–1.2)
Prothrombin Time: 14.4 s (ref 11.4–15.2)

## 2023-09-30 LAB — PROCALCITONIN: Procalcitonin: <0.1 ng/mL

## 2023-09-30 LAB — TSH: TSH: 0.763 u[IU]/mL (ref 0.350–4.500)

## 2023-09-30 LAB — RESP PANEL BY RT-PCR (RSV, FLU A&B, COVID)  RVPGX2
Influenza A by PCR: NEGATIVE
Influenza B by PCR: NEGATIVE
Resp Syncytial Virus by PCR: NEGATIVE
SARS Coronavirus 2 by RT PCR: NEGATIVE

## 2023-09-30 LAB — LACTIC ACID, PLASMA: Lactic Acid, Venous: 0.9 mmol/L (ref 0.5–1.9)

## 2023-09-30 LAB — MAGNESIUM: Magnesium: 2.2 mg/dL (ref 1.7–2.4)

## 2023-09-30 LAB — GROUP A STREP BY PCR: Group A Strep by PCR: NOT DETECTED

## 2023-09-30 MED ORDER — VANCOMYCIN HCL IN DEXTROSE 1-5 GM/200ML-% IV SOLN
1000.0000 mg | Freq: Once | INTRAVENOUS | Status: DC
  Administered 2023-09-30: 1000 mg via INTRAVENOUS
  Filled 2023-09-30: qty 200

## 2023-09-30 MED ORDER — ACETAMINOPHEN 650 MG RE SUPP: 650.0000 mg | Freq: Four times a day (QID) | RECTAL | Status: DC | PRN

## 2023-09-30 MED ORDER — METRONIDAZOLE 500 MG/100ML IV SOLN
500.0000 mg | Freq: Once | INTRAVENOUS | Status: AC
Start: 2023-09-30 — End: 2023-09-30
  Administered 2023-09-30: 500 mg via INTRAVENOUS
  Filled 2023-09-30: qty 100

## 2023-09-30 MED ORDER — ONDANSETRON HCL 4 MG PO TABS: 4.0000 mg | ORAL_TABLET | Freq: Four times a day (QID) | ORAL | Status: DC | PRN

## 2023-09-30 MED ORDER — SODIUM CHLORIDE 0.9 % IV SOLN
2.0000 g | Freq: Once | INTRAVENOUS | Status: AC
  Administered 2023-09-30: 2 g via INTRAVENOUS
  Filled 2023-09-30: qty 12.5

## 2023-09-30 MED ORDER — HEPARIN SODIUM (PORCINE) 5000 UNIT/ML IJ SOLN
5000.0000 [IU] | Freq: Three times a day (TID) | INTRAMUSCULAR | Status: DC
  Administered 2023-09-30 – 2023-10-03 (×8): 5000 [IU] via SUBCUTANEOUS
  Filled 2023-09-30 (×8): qty 1

## 2023-09-30 MED ORDER — SODIUM CHLORIDE 0.9 % IV SOLN
3.0000 g | Freq: Four times a day (QID) | INTRAVENOUS | Status: DC
  Administered 2023-09-30 (×2): 3 g via INTRAVENOUS
  Filled 2023-09-30 (×3): qty 8

## 2023-09-30 MED ORDER — LACTATED RINGERS IV BOLUS
1000.0000 mL | Freq: Once | INTRAVENOUS | Status: AC
Start: 2023-09-30 — End: 2023-09-30
  Administered 2023-09-30: 1000 mL via INTRAVENOUS

## 2023-09-30 MED ORDER — VANCOMYCIN HCL IN DEXTROSE 1-5 GM/200ML-% IV SOLN
1000.0000 mg | Freq: Once | INTRAVENOUS | Status: AC
  Administered 2023-09-30: 1000 mg via INTRAVENOUS
  Filled 2023-09-30: qty 200

## 2023-09-30 MED ORDER — LACTATED RINGERS IV BOLUS
1000.0000 mL | Freq: Once | INTRAVENOUS | Status: AC
  Administered 2023-09-30: 1000 mL via INTRAVENOUS

## 2023-09-30 MED ORDER — VANCOMYCIN HCL IN DEXTROSE 1-5 GM/200ML-% IV SOLN: 1000.0000 mg | Freq: Once | INTRAVENOUS | Status: DC

## 2023-09-30 MED ORDER — METOPROLOL TARTRATE 5 MG/5ML IV SOLN
5.0000 mg | Freq: Once | INTRAVENOUS | Status: AC
  Administered 2023-09-30: 5 mg via INTRAVENOUS
  Filled 2023-09-30: qty 5

## 2023-09-30 MED ORDER — METOPROLOL TARTRATE 5 MG/5ML IV SOLN: 5.0000 mg | INTRAVENOUS | Status: DC | PRN

## 2023-09-30 MED ORDER — SENNOSIDES-DOCUSATE SODIUM 8.6-50 MG PO TABS: 1.0000 | ORAL_TABLET | Freq: Every evening | ORAL | Status: DC | PRN

## 2023-09-30 MED ORDER — ACETAMINOPHEN 325 MG PO TABS
650.0000 mg | ORAL_TABLET | Freq: Once | ORAL | Status: AC | PRN
  Administered 2023-09-30: 650 mg via ORAL
  Filled 2023-09-30: qty 2

## 2023-09-30 MED ORDER — ACETAMINOPHEN 325 MG PO TABS
650.0000 mg | ORAL_TABLET | Freq: Four times a day (QID) | ORAL | Status: DC | PRN
  Administered 2023-09-30 – 2023-10-03 (×5): 650 mg via ORAL
  Filled 2023-09-30 (×5): qty 2

## 2023-09-30 MED ORDER — ONDANSETRON HCL 4 MG/2ML IJ SOLN: 4.0000 mg | Freq: Four times a day (QID) | INTRAMUSCULAR | Status: DC | PRN

## 2023-09-30 NOTE — Consult Note (Signed)
CODE SEPSIS - PHARMACY COMMUNICATION  **Broad Spectrum Antibiotics should be administered within 1 hour of Sepsis diagnosis**  Time Code Sepsis Called/Page Received: 1215  Antibiotics Ordered: Cefepime, vancomycin, and netronidazole  Time of 1st antibiotic administration: 1218  Additional action taken by pharmacy: none  If necessary, Name of Provider/Nurse Contacted: n/a    Selso Mannor Rodriguez-Guzman PharmD, BCPS 09/30/2023 12:21 PM

## 2023-09-30 NOTE — ED Provider Notes (Signed)
[facility] Provider Note    Event Date/Time   First MD Initiated Contact with Patient 09/30/23 1133     (approximate)   History   Chief Complaint Fever and Elevated WBC    HPI  Emma Hall is a 57 y.o. female with past medical history of anemia and endometriosis who presents to the ED complain of fevers.  Patient reports that she has been dealing with headache and diffuse bodyaches for the past week.  She describes a throbbing pain over the right side of her forehead that is intermittent, seems to be worse in the evening.  She began having night sweats the past 2 nights, but had not checked her temperature at home.  She states soreness and stiffness throughout much of her back, arms, and legs along with this.  She has had some cough and congestion with postnasal drip, denies any shortness of breath or pain in her chest.  She has not had any abdominal pain, nausea, vomiting, diarrhea, or dysuria.  She is not aware of any sick contacts.  She initially presented to the walk-in clinic, was referred to the ED for further evaluation.     Physical Exam   Triage Vital Signs: ED Triage Vitals  Encounter Vitals Group     BP 09/30/23 1115 (!) 148/93     Systolic BP Percentile --      Diastolic BP Percentile --      Pulse Rate 09/30/23 1115 (!) 140     Resp 09/30/23 1115 (!) 26     Temp 09/30/23 1115 (!) 100.7 F (38.2 C)     Temp Source 09/30/23 1115 Oral     SpO2 09/30/23 1115 100 %     Weight 09/30/23 1117 204 lb (92.5 kg)     Height --      Head Circumference --      Peak Flow --      Pain Score 09/30/23 1116 10     Pain Loc --      Pain Education --      Exclude from Growth Chart --     Most recent vital signs: Vitals:   09/30/23 1300 09/30/23 1312  BP: (!) 139/90   Pulse: (!) 131 83  Resp: (!) 21 (!) 22  Temp:  99.3 F (37.4 C)  SpO2: 100% 100%    Constitutional: Alert and oriented. Eyes: Conjunctivae are normal. Head:  Atraumatic. Nose: No congestion/rhinnorhea. Mouth/Throat: Mucous membranes are moist.  Neck: Supple with no meningismus. Cardiovascular: Tachycardic, regular rhythm. Grossly normal heart sounds.  2+ radial pulses bilaterally. Respiratory: Normal respiratory effort.  No retractions. Lungs CTAB. Gastrointestinal: Soft and nontender. No distention. Musculoskeletal: No lower extremity tenderness nor edema.  Neurologic:  Normal speech and language. No gross focal neurologic deficits are appreciated.    ED Results / Procedures / Treatments   Labs (all labs ordered are listed, but only abnormal results are displayed) Labs Reviewed  COMPREHENSIVE METABOLIC PANEL - Abnormal; Notable for the following components:      Result Value   Glucose, Bld 110 (*)    All other components within normal limits  CBC WITH DIFFERENTIAL/PLATELET - Abnormal; Notable for the following components:   WBC 13.7 (*)    Neutro Abs 10.7 (*)    All other components within normal limits  URINALYSIS, W/ REFLEX TO CULTURE (INFECTION SUSPECTED) - Abnormal; Notable for the following components:   Color, Urine STRAW (*)    APPearance CLEAR (*)  All other components within normal limits  RESP PANEL BY RT-PCR (RSV, FLU A&B, COVID)  RVPGX2  CULTURE, BLOOD (ROUTINE X 2)  CULTURE, BLOOD (ROUTINE X 2)  LACTIC ACID, PLASMA  PROTIME-INR  PROCALCITONIN  TSH  T4, FREE  MAGNESIUM  LACTIC ACID, PLASMA  HIV ANTIBODY (ROUTINE TESTING W REFLEX)     EKG  ED ECG REPORT I, Chesley Noon, the attending physician, personally viewed and interpreted this ECG.   Date: 09/30/2023  EKG Time: 11:19  Rate: 137  Rhythm: sinus tachycardia vs atrial flutter with 2:1 block  Axis: Normal  Intervals: Prolonged QT  ST&T Change: None  ED ECG REPORT I, Chesley Noon, the attending physician, personally viewed and interpreted this ECG.   Date: 09/30/2023  EKG Time: 12:10  Rate: 135  Rhythm: sinus tachycardia vs atrial flutter  with 2:1 block  Axis: Normal  Intervals: Prolonged QT  ST&T Change: None  ED ECG REPORT I, Chesley Noon, the attending physician, personally viewed and interpreted this ECG.   Date: 09/30/2023  EKG Time: 13:29  Rate: 88  Rhythm: normal sinus rhythm  Axis: Normal  Intervals: Prolonged QT  ST&T Change: None   RADIOLOGY Chest x-ray reviewed and interpreted by me with no infiltrate, edema, or effusion.  PROCEDURES:  Critical Care performed: Yes, see critical care procedure note(s)  .Critical Care  Performed by: Chesley Noon, MD Authorized by: Chesley Noon, MD   Critical care provider statement:    Critical care time (minutes):  30   Critical care time was exclusive of:  Separately billable procedures and treating other patients and teaching time   Critical care was necessary to treat or prevent imminent or life-threatening deterioration of the following conditions:  Sepsis and cardiac failure   Critical care was time spent personally by me on the following activities:  Development of treatment plan with patient or surrogate, discussions with consultants, evaluation of patient's response to treatment, examination of patient, ordering and review of laboratory studies, ordering and review of radiographic studies, ordering and performing treatments and interventions, pulse oximetry, re-evaluation of patient's condition and review of old charts   I assumed direction of critical care for this patient from another provider in my specialty: no     Care discussed with: admitting provider      MEDICATIONS ORDERED IN ED: Medications  vancomycin (VANCOCIN) IVPB 1000 mg/200 mL premix (0 mg Intravenous Paused 09/30/23 1356)    Followed by  vancomycin (VANCOCIN) IVPB 1000 mg/200 mL premix (1,000 mg Intravenous New Bag/Given 09/30/23 1357)  acetaminophen (TYLENOL) tablet 650 mg (has no administration in time range)    Or  acetaminophen (TYLENOL) suppository 650 mg (has no  administration in time range)  ondansetron (ZOFRAN) tablet 4 mg (has no administration in time range)    Or  ondansetron (ZOFRAN) injection 4 mg (has no administration in time range)  heparin injection 5,000 Units (has no administration in time range)  senna-docusate (Senokot-S) tablet 1 tablet (has no administration in time range)  acetaminophen (TYLENOL) tablet 650 mg (650 mg Oral Given 09/30/23 1123)  lactated ringers bolus 1,000 mL (0 mLs Intravenous Stopped 09/30/23 1303)  ceFEPIme (MAXIPIME) 2 g in sodium chloride 0.9 % 100 mL IVPB (0 g Intravenous Stopped 09/30/23 1246)  metroNIDAZOLE (FLAGYL) IVPB 500 mg (0 mg Intravenous Stopped 09/30/23 1331)  metoprolol tartrate (LOPRESSOR) injection 5 mg (5 mg Intravenous Given 09/30/23 1246)  lactated ringers bolus 1,000 mL (1,000 mLs Intravenous New Bag/Given 09/30/23 1303)     IMPRESSION /  MDM / ASSESSMENT AND PLAN / ED COURSE  I reviewed the triage vital signs and the nursing notes.                              57 y.o. female with past medical history of anemia and endometriosis who presents to the ED complaining of fever, headache, and diffuse bodyaches for the past week.  Patient's presentation is most consistent with acute presentation with potential threat to life or bodily function.  Differential diagnosis includes, but is not limited to, sepsis, meningitis, viral syndrome, pneumonia, UTI, dehydration, electrolyte abnormality, AKI.  Patient well-appearing and in no acute distress, vital signs remarkable for fever, tachycardia, and mild tachypnea.  Despite this, she appears very well with primary symptoms being headache, body aches, and congestion.  No findings concerning for meningitis on exam as she has good range of motion of her neck.  Lab results are pending at this time, will hold off on antibiotics for the time being given likely viral source.  EKG does show sinus tachycardia versus atrial flutter with 2-1 block and we will  observe on cardiac monitor.  EKG reviewed with Dr. Mariah Milling of cardiology, who suspects sinus tachycardia versus atrial tachycardia.  Following additional IV fluids and 5 mg of IV metoprolol, patient had sudden drop in heart rate to the 80s, now appears to be in normal sinus rhythm with ongoing QT prolongation.  Labs with leukocytosis, no significant anemia, electrolyte abnormality, or AKI.  LFTs are unremarkable, lactic acid within normal limits, and procalcitonin undetectable.  Patient started on broad-spectrum IV antibiotics given concerning vital signs and possible sepsis, no obvious source at this time and would consider viral etiology.  However, she would benefit from admission to the hospital for further sepsis rule out.  Case discussed with hospitalist for admission.      FINAL CLINICAL IMPRESSION(S) / ED DIAGNOSES   Final diagnoses:  Sepsis without acute organ dysfunction, due to unspecified organism [facility])  Atrial tachycardia (HCC)     Rx / DC Orders   ED Discharge Orders     None        Note:  This document was prepared using Dragon voice recognition software and may include unintentional dictation errors.   Chesley Noon, MD 09/30/23 1426

## 2023-09-30 NOTE — Assessment & Plan Note (Addendum)
Query atrial tachycardia Improved with metoprolol 5 mg IV one-time dose Metoprolol 5 mg IV every 4 hours as needed for heart rate greater than 115, 3 doses ordered If atrial tachycardia persist, a.m. team to consult cardiology and or refer patient for outpatient cardiology as appropriate

## 2023-09-30 NOTE — ED Notes (Addendum)
Pt given gown, socks and pt belongings bag. Pt ambulated to restroom w steady gate.

## 2023-09-30 NOTE — Assessment & Plan Note (Addendum)
With diffuse sinus congestion Unasyn per pharmacy

## 2023-09-30 NOTE — Progress Notes (Signed)
Elink following for sepsis protocol. 

## 2023-09-30 NOTE — Consult Note (Addendum)
Pharmacy Antibiotic Note  Emma Hall is a 57 y.o. female admitted on 09/30/2023 with otitis media. Pharmacy has been consulted for cefepime and Vancomycin dosing, then changed to Unasyn   Plan: Unasyn 3gm IV q 6hrs  Weight: 92.5 kg (204 lb)  Temp (24hrs), Avg:100 F (37.8 C), Min:99.3 F (37.4 C), Max:100.7 F (38.2 C)  Recent Labs  Lab 09/30/23 1119  WBC 13.7*  CREATININE 0.96  LATICACIDVEN 0.9    CrCl cannot be calculated (Unknown ideal weight.).    No Known Allergies  Antimicrobials this admission: Unasyn 11/16>>  Dose adjustments this admission: n/a  Microbiology results: 11/16 BCx: in process 11/16 Resp panel : negative  Thank you for allowing pharmacy to be a part of this patient's care.  Wanza Szumski Rodriguez-Guzman PharmD, BCPS 09/30/2023 2:45 PM

## 2023-09-30 NOTE — Assessment & Plan Note (Addendum)
Recheck CBC in the a.m.

## 2023-09-30 NOTE — ED Notes (Signed)
Called lab to add on Strep antigen to urine

## 2023-09-30 NOTE — Assessment & Plan Note (Signed)
Counseled patient on slow and healthy weight loss Patient endorses understanding and compliance

## 2023-09-30 NOTE — ED Notes (Signed)
Pt to ED for fever, HA, body aches since 1 week, HR is 140 and placed on cardiac monitor per order. Pt has unlabored respirations and appears well.

## 2023-09-30 NOTE — Consult Note (Signed)
PHARMACY -  BRIEF ANTIBIOTIC NOTE   Pharmacy has received consult(s) for Cefepime and Vancomycin from an ED provider.  The patient's profile has been reviewed for ht/wt/allergies/indication/available labs.    One time order(s) placed for : Vancomycin 2000mg  IV x 1 (2 doses of 1000 mg each) Cefepime 2gm IV x 1  Further antibiotics/pharmacy consults should be ordered by admitting physician if indicated.                       Thank you, Addisynn Vassell Rodriguez-Guzman PharmD, BCPS 09/30/2023 12:20 PM

## 2023-09-30 NOTE — Hospital Course (Addendum)
Ms. Emma Hall is a 57 year old female with history of depression, history of avascular necrosis, bilateral carpal tunnel syndrome, history of restless leg syndrome, leg cramping, who presents to the emergency department from outpatient clinic for chief concerns of fevers of unknown origin and sinus tachycardia.  Tmax showed temperature of 100.7, respiration rate of 26, heart rate initially 140 and improved to 92, blood pressure 148/93, improved to 136/90, SpO2 of 100% on room air.  Serum sodium is 137, potassium 3.7, chloride 101, bicarb 26, BUN of 11, serum creatinine of 0.96, EGFR greater than 60, nonfasting blood glucose 110, WBC 13.7, hemoglobin 12.2, platelets of 389.  UA was negative for leukocytes and negative for nitrates.  COVID/influenza A/influenza B/RSV PCR were negative.  ED treatment: Metoprolol 50 mg IV one-time dose, cefepime 2 g IV, Flagyl, vancomycin, LR 2 L bolus were ordered

## 2023-09-30 NOTE — H&P (Addendum)
History and Physical   Emma Hall ZOX:[id number] DOB: January 01, 1966 DOA: 09/30/2023  PCP: Aliene Beams, MD  Outpatient Specialists: Dr. Maggie Schwalbe, rheumatologist; Dr. Malvin Johns, neurologist Patient coming from: Outpatient clinic  I have personally briefly reviewed patient's old medical records in [facility] EMR.  Chief Concern: Fever  HPI: Ms. Emma Hall is a 57 year old female with history of depression, history of avascular necrosis, bilateral carpal tunnel syndrome, history of restless leg syndrome, leg cramping, who presents to the emergency department from outpatient clinic for chief concerns of fevers of unknown origin and sinus tachycardia.  Tmax showed temperature of 100.7, respiration rate of 26, heart rate initially 140 and improved to 92, blood pressure 148/93, improved to 136/90, SpO2 of 100% on room air.  Serum sodium is 137, potassium 3.7, chloride 101, bicarb 26, BUN of 11, serum creatinine of 0.96, EGFR greater than 60, nonfasting blood glucose 110, WBC 13.7, hemoglobin 12.2, platelets of 389.  UA was negative for leukocytes and negative for nitrates.  COVID/influenza A/influenza B/RSV PCR were negative.  ED treatment: Metoprolol 50 mg IV one-time dose, cefepime 2 g IV, Flagyl, vancomycin, LR 2 L bolus were ordered --------------------------------- At bedside, patient able to tell me her name, age, location, current, the year.  She reports that over the last 4 to 5 days she has been having a right-sided headache along with increased congestion and decreased hearing.  She just assumed that she is aging in terms of the hearing.  The hearing is worse on the right side.  She denies fever, cough, nausea, vomiting, chest pain, shortness of breath, abdominal pain, dysuria, hematuria, diarrhea.  Social history: She lives at home with her parents, and her grandson. She denies tobacco, etoh, and recreational drug use. 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  ROS: Constitutional: no weight change, no fever ENT/Mouth: no sore throat, no rhinorrhea, + congestion, + changes to hearing R>L Eyes: no eye pain, no vision changes Cardiovascular: no chest pain, no dyspnea,  no edema, no palpitations Respiratory: no cough, no sputum, no wheezing Gastrointestinal: no nausea, no vomiting, no diarrhea, no constipation Genitourinary: no urinary incontinence, no dysuria, no hematuria Musculoskeletal: no arthralgias, no myalgias Skin: no skin lesions, no pruritus, Neuro: + weakness, no loss of consciousness, no syncope Psych: no anxiety, no depression, + decrease appetite Heme/Lymph: no bruising, no bleeding  ED Course: discussed with the EDP, patient requiring hospitalization for chief concerns of fever of unknown origin.  Assessment/Plan  Principal Problem:   Fever of unknown origin Active Problems:   DDD (degenerative disc disease), lumbosacral   Irregular heart beat   Leukocytosis   Right otitis media   Truncal obesity  Assessment and Plan:  * Fever of unknown origin Etiology workup in progress Suspect secondary to right otitis media Will check 20 pathogen respiratory panel, and follow with a second lactic acid Blood cultures x 2 are in process Continue with cefepime and vancomycin Recheck CBC in the a.m. Admit to telemetry medical  Truncal obesity Counseled patient on slow and healthy weight loss Patient endorses understanding and compliance  Right otitis media With diffuse sinus congestion Unasyn per pharmacy  Leukocytosis Recheck CBC in the a.m.  Irregular heart beat Query atrial tachycardia Improved with metoprolol 5 mg IV one-time dose Metoprolol 5 mg IV every 4 hours as needed for heart rate greater than 115, 3 doses ordered If atrial tachycardia persist, a.m. team to consult cardiology and or refer patient for  outpatient cardiology as appropriate  Chart reviewed.   DVT prophylaxis: Heparin 5000  units subcutaneous every 8 hours Code Status: Full code Diet: Regular diet Family Communication: Updated friend at bedside with patient's permission Disposition Plan: Pending clinical course Consults called: None at this time Admission status: PCU, inpatient initially and now changed to telemetry medical  Past Medical History:  Diagnosis Date   Anemia    Arthritis    right knee right hip lower back   Endometriosis    Fibroid    Heart palpitations    Vitamin D deficiency    Past Surgical History:  Procedure Laterality Date   ABDOMINAL HYSTERECTOMY     COLONOSCOPY WITH PROPOFOL N/A 03/09/2020   Procedure: COLONOSCOPY WITH PROPOFOL;  Surgeon: Wyline Mood, MD;  Location: [facility] ENDOSCOPY;  Service: Gastroenterology;  Laterality: N/A;   GALLBLADDER SURGERY     Social History:  reports that she has never smoked. She has never used smokeless tobacco. She reports that she does not currently use alcohol. She reports that she does not use drugs.  No Known Allergies Family History  Problem Relation Age of Onset   Healthy Mother    Healthy Father    Breast cancer Maternal Aunt 8   Breast cancer Maternal Aunt 44   Breast cancer Maternal Aunt 30   Breast cancer Sister    Stroke Sister    Family history: Family history reviewed and not pertinent.  Prior to Admission medications   Medication Sig Start Date End Date Taking? Authorizing Provider  escitalopram (LEXAPRO) 5 MG tablet Take 5 mg by mouth daily. 01/22/20   [provider]  ferrous sulfate 325 (65 FE) MG EC tablet Take 325 mg by mouth 3 (three) times daily with meals.    [provider]  LINZESS 145 MCG CAPS capsule TAKE 1 CAPSULE(145 MCG) BY MOUTH DAILY BEFORE BREAKFAST 06/02/19   Hildred Laser, MD  Multiple Vitamin (MULTI VITAMIN DAILY PO) Take by mouth.    [provider]  oxyCODONE-acetaminophen (PERCOCET/ROXICET) 5-325 MG tablet Take 1-2 tablets by mouth every 6 (six) hours as needed for severe pain.  08/05/21   Valeria Batman, MD  vitamin B-12 (CYANOCOBALAMIN) 1000 MCG tablet Take 1,000 mcg by mouth daily.    [provider]   Physical Exam: Vitals:   09/30/23 1312 09/30/23 1435 09/30/23 1809 09/30/23 1810  BP:    130/73  Pulse: 83   94  Resp: (!) 22   17  Temp: 99.3 F (37.4 C)  98.1 F (36.7 C)   TempSrc: Oral  Oral   SpO2: 100%   97%  Weight:      Height:  5\' 7"  (1.702 m)     Constitutional: appears age appropriate, NAD, calm Eyes: PERRL, lids and conjunctivae normal HENMT: Mucous membranes are moist. Posterior pharynx clear of any exudate or lesions. Age-appropriate dentition. Hearing appropriate. Right tympanic membrane and surrounding ear tissue positive for fluid, edema, erythema.  Left tympanic membrane is more difficult to visualize due to increased cerumen.  Mild redness compared to the right TM. Neck: normal, supple, no masses, no thyromegaly Respiratory: clear to auscultation bilaterally, no wheezing, no crackles. Normal respiratory effort. No accessory muscle use.  Cardiovascular: Regular rate and rhythm, no murmurs / rubs / gallops. No extremity edema. 2+ pedal pulses. No carotid bruits.  Abdomen: obese abdomen, no tenderness, no masses palpated, no hepatosplenomegaly. Bowel sounds positive.  Musculoskeletal: no clubbing / cyanosis. No joint deformity upper and lower extremities. Good ROM,  no contractures, no atrophy. Normal muscle tone.  Negative for neck discomfort on flexion and extension Skin: no rashes, lesions, ulcers. No induration Neurologic: Sensation intact. Strength 5/5 in all 4.  Psychiatric: Normal judgment and insight. Alert and oriented x 3. Normal mood.   EKG: independently reviewed, showing sinus rhythm with rate of 88, QTc 564  Chest x-ray on Admission: I personally reviewed and I agree with radiologist reading as below.  DG Chest 2 View  Result Date: 09/30/2023 CLINICAL DATA:  57 year old female with suspected sepsis. EXAM: CHEST -  2 VIEW COMPARISON:  No priors. FINDINGS: The heart size and mediastinal contours are within normal limits. Both lungs are clear. The visualized skeletal structures are unremarkable. IMPRESSION: No active cardiopulmonary disease. Electronically Signed   By: Trudie Reed M.D.   On: 09/30/2023 12:36    Labs on Admission: I have personally reviewed following labs CBC: Recent Labs  Lab 09/30/23 1119  WBC 13.7*  NEUTROABS 10.7*  HGB 12.2  HCT 37.5  MCV 84.7  PLT 389   Basic Metabolic Panel: Recent Labs  Lab 09/30/23 1119  NA 137  K 3.7  CL 101  CO2 26  GLUCOSE 110*  BUN 11  CREATININE 0.96  CALCIUM 9.2  MG 2.2   GFR: Estimated Creatinine Clearance: 76.4 mL/min (by C-G formula based on SCr of 0.96 mg/dL).  Liver Function Tests: Recent Labs  Lab 09/30/23 1119  AST 16  ALT 15  ALKPHOS 87  BILITOT 0.6  PROT 8.0  ALBUMIN 4.3   Coagulation Profile: Recent Labs  Lab 09/30/23 1119  INR 1.1   Thyroid Function Tests: Recent Labs    09/30/23 1119  TSH 0.763  FREET4 0.80   Urine analysis:    Component Value Date/Time   COLORURINE STRAW (A) 09/30/2023 1240   APPEARANCEUR CLEAR (A) 09/30/2023 1240   LABSPEC 1.005 09/30/2023 1240   PHURINE 7.0 09/30/2023 1240   GLUCOSEU NEGATIVE 09/30/2023 1240   HGBUR NEGATIVE 09/30/2023 1240   BILIRUBINUR NEGATIVE 09/30/2023 1240   KETONESUR NEGATIVE 09/30/2023 1240   PROTEINUR NEGATIVE 09/30/2023 1240   NITRITE NEGATIVE 09/30/2023 1240   LEUKOCYTESUR NEGATIVE 09/30/2023 1240   This document was prepared using Dragon Voice Recognition software and may include unintentional dictation errors.  Dr. Sedalia Muta [facility]  If 7PM-7AM, please contact overnight-coverage provider If 7AM-7PM, please contact day attending provider [email or web address]  09/30/2023, 7:00 PM

## 2023-09-30 NOTE — ED Triage Notes (Signed)
Pt to ED via [facility]. Pt reports HA at night x1 wk that has been getting worse. Pt reports pain is more so on right side of face. Pt was seen at [facility] and found to have a fever. [facility] did blood work and WBC was elevated. Pt tachycardic in triage with HR 140s. Pt also endorses SOB.

## 2023-09-30 NOTE — Assessment & Plan Note (Addendum)
Etiology workup in progress Suspect secondary to right otitis media Will check 20 pathogen respiratory panel, and follow with a second lactic acid Blood cultures x 2 are in process Continue with cefepime and vancomycin Recheck CBC in the a.m. Admit to telemetry medical

## 2023-10-01 DIAGNOSIS — R509 Fever, unspecified: Secondary | ICD-10-CM

## 2023-10-01 LAB — BLOOD CULTURE ID PANEL (REFLEXED) - BCID2

## 2023-10-01 LAB — CBC
HCT: 31.6 % — ABNORMAL LOW (ref 36.0–46.0)
Hemoglobin: 10.6 g/dL — ABNORMAL LOW (ref 12.0–15.0)
MCH: 27.4 pg (ref 26.0–34.0)
MCHC: 33.5 g/dL (ref 30.0–36.0)
MCV: 81.7 fL (ref 80.0–100.0)
Platelets: 356 10*3/uL (ref 150–400)
RBC: 3.87 MIL/uL (ref 3.87–5.11)
RDW: 14.1 % (ref 11.5–15.5)
WBC: 10.1 10*3/uL (ref 4.0–10.5)
nRBC: 0 % (ref 0.0–0.2)

## 2023-10-01 LAB — BASIC METABOLIC PANEL
Anion gap: 8 (ref 5–15)
BUN: 11 mg/dL (ref 6–20)
CO2: 27 mmol/L (ref 22–32)
Calcium: 8.7 mg/dL — ABNORMAL LOW (ref 8.9–10.3)
Chloride: 103 mmol/L (ref 98–111)
Creatinine, Ser: 0.98 mg/dL (ref 0.44–1.00)
GFR, Estimated: >60 mL/min (ref >60)
Glucose, Bld: 99 mg/dL (ref 70–99)
Potassium: 3.4 mmol/L — ABNORMAL LOW (ref 3.5–5.1)
Sodium: 138 mmol/L (ref 135–145)

## 2023-10-01 LAB — RESPIRATORY PANEL BY PCR

## 2023-10-01 LAB — STREP PNEUMONIAE URINARY ANTIGEN: Strep Pneumo Urinary Antigen: NEGATIVE

## 2023-10-01 MED ORDER — BUTALBITAL-APAP-CAFFEINE 50-325-40 MG PO TABS
1.0000 | ORAL_TABLET | Freq: Four times a day (QID) | ORAL | Status: DC | PRN
  Administered 2023-10-01 (×2): 1 via ORAL
  Filled 2023-10-01 (×2): qty 1

## 2023-10-01 MED ORDER — CEFAZOLIN SODIUM-DEXTROSE 2-4 GM/100ML-% IV SOLN
2.0000 g | Freq: Three times a day (TID) | INTRAVENOUS | Status: DC
  Administered 2023-10-01: 2 g via INTRAVENOUS
  Filled 2023-10-01 (×2): qty 100

## 2023-10-01 MED ORDER — SODIUM CHLORIDE 0.9 % IV SOLN
3.0000 g | Freq: Four times a day (QID) | INTRAVENOUS | Status: DC
  Administered 2023-10-01 – 2023-10-03 (×8): 3 g via INTRAVENOUS
  Filled 2023-10-01 (×9): qty 8

## 2023-10-01 NOTE — Progress Notes (Signed)
PHARMACY - PHYSICIAN COMMUNICATION CRITICAL VALUE ALERT - BLOOD CULTURE IDENTIFICATION (BCID)  Results for orders placed or performed during the hospital encounter of 09/30/23  Culture, blood (Routine x 2)     Status: None (Preliminary result)   Collection Time: 09/30/23 11:19 AM   Specimen: BLOOD  Result Value Ref Range Status   Specimen Description BLOOD RIGHT ANTECUBITAL  Final   Special Requests   Final    BOTTLES DRAWN AEROBIC AND ANAEROBIC Blood Culture results may not be optimal due to an inadequate volume of blood received in culture bottles   Culture  Setup Time   Final    GRAM POSITIVE COCCI ANAEROBIC BOTTLE ONLY Organism ID to follow CRITICAL RESULT CALLED TO, READ BACK BY AND VERIFIED WITH: Cyndie Woodbeck @ 0307 10/01/23 LFD Performed at [facility], [address] [place], [place] [address]    Culture GRAM POSITIVE COCCI  Final   Report Status PENDING  Incomplete  Blood Culture ID Panel (Reflexed)     Status: Abnormal   Collection Time: 09/30/23 11:19 AM  Result Value Ref Range Status   Enterococcus faecalis NOT DETECTED NOT DETECTED Final   Enterococcus Faecium NOT DETECTED NOT DETECTED Final   Listeria monocytogenes NOT DETECTED NOT DETECTED Final   Staphylococcus species DETECTED (A) NOT DETECTED Final    Comment: CRITICAL RESULT CALLED TO, READ BACK BY AND VERIFIED WITH: Kalep Full @ 0307 10/01/23 LFD    Staphylococcus aureus (BCID) NOT DETECTED NOT DETECTED Final   Staphylococcus epidermidis DETECTED (A) NOT DETECTED Final    Comment: CRITICAL RESULT CALLED TO, READ BACK BY AND VERIFIED WITH: Ovie Eastep @0307  10/01/23 LFD    Staphylococcus lugdunensis NOT DETECTED NOT DETECTED Final   Streptococcus species NOT DETECTED NOT DETECTED Final   Streptococcus agalactiae NOT DETECTED NOT DETECTED Final   Streptococcus pneumoniae NOT DETECTED NOT DETECTED Final   Streptococcus pyogenes NOT DETECTED NOT DETECTED Final   A.calcoaceticus-baumannii NOT  DETECTED NOT DETECTED Final   Bacteroides fragilis NOT DETECTED NOT DETECTED Final   Enterobacterales NOT DETECTED NOT DETECTED Final   Enterobacter cloacae complex NOT DETECTED NOT DETECTED Final   Escherichia coli NOT DETECTED NOT DETECTED Final   Klebsiella aerogenes NOT DETECTED NOT DETECTED Final   Klebsiella oxytoca NOT DETECTED NOT DETECTED Final   Klebsiella pneumoniae NOT DETECTED NOT DETECTED Final   Proteus species NOT DETECTED NOT DETECTED Final   Salmonella species NOT DETECTED NOT DETECTED Final   Serratia marcescens NOT DETECTED NOT DETECTED Final   Haemophilus influenzae NOT DETECTED NOT DETECTED Final   Neisseria meningitidis NOT DETECTED NOT DETECTED Final   Pseudomonas aeruginosa NOT DETECTED NOT DETECTED Final   Stenotrophomonas maltophilia NOT DETECTED NOT DETECTED Final   Candida albicans NOT DETECTED NOT DETECTED Final   Candida auris NOT DETECTED NOT DETECTED Final   Candida glabrata NOT DETECTED NOT DETECTED Final   Candida krusei NOT DETECTED NOT DETECTED Final   Candida parapsilosis NOT DETECTED NOT DETECTED Final   Candida tropicalis NOT DETECTED NOT DETECTED Final   Cryptococcus neoformans/gattii NOT DETECTED NOT DETECTED Final   Methicillin resistance mecA/C NOT DETECTED NOT DETECTED Final    Comment: Performed at [facility], [address] [place], [place] [address]  Resp panel by RT-PCR (RSV, Flu A&B, Covid) Anterior Nasal Swab     Status: None   Collection Time: 09/30/23 11:50 AM   Specimen: Anterior Nasal Swab  Result Value Ref Range Status   SARS Coronavirus 2 by RT PCR NEGATIVE  NEGATIVE Final    Comment: (NOTE) SARS-CoV-2 target nucleic acids are NOT DETECTED.  The SARS-CoV-2 RNA is generally detectable in upper respiratory specimens during the acute phase of infection. The lowest concentration of SARS-CoV-2 viral copies this assay can detect is 138 copies/mL. A negative result does not preclude SARS-Cov-2 infection and should not  be used as the sole basis for treatment or other patient management decisions. A negative result may occur with  improper specimen collection/handling, submission of specimen other than nasopharyngeal swab, presence of viral mutation(s) within the areas targeted by this assay, and inadequate number of viral copies(<138 copies/mL). A negative result must be combined with clinical observations, patient history, and epidemiological information. The expected result is Negative.  Fact Sheet for Patients:  BloggerCourse.com  Fact Sheet for Healthcare Providers:  SeriousBroker.it  This test is no t yet approved or cleared by the [place] FDA and  has been authorized for detection and/or diagnosis of SARS-CoV-2 by FDA under an Emergency Use Authorization (EUA). This EUA will remain  in effect (meaning this test can be used) for the duration of the COVID-19 declaration under Section 564(b)(1) of the Act, 21 U.S.C.section 360bbb-3(b)(1), unless the authorization is terminated  or revoked sooner.       Influenza A by PCR NEGATIVE NEGATIVE Final   Influenza B by PCR NEGATIVE NEGATIVE Final    Comment: (NOTE) The Xpert Xpress SARS-CoV-2/FLU/RSV plus assay is intended as an aid in the diagnosis of influenza from Nasopharyngeal swab specimens and should not be used as a sole basis for treatment. Nasal washings and aspirates are unacceptable for Xpert Xpress SARS-CoV-2/FLU/RSV testing.  Fact Sheet for Patients: BloggerCourse.com  Fact Sheet for Healthcare Providers: SeriousBroker.it  This test is not yet approved or cleared by the [place] FDA and has been authorized for detection and/or diagnosis of SARS-CoV-2 by FDA under an Emergency Use Authorization (EUA). This EUA will remain in effect (meaning this test can be used) for the duration of the COVID-19 declaration under Section  564(b)(1) of the Act, 21 U.S.C. section 360bbb-3(b)(1), unless the authorization is terminated or revoked.     Resp Syncytial Virus by PCR NEGATIVE NEGATIVE Final    Comment: (NOTE) Fact Sheet for Patients: BloggerCourse.com  Fact Sheet for Healthcare Providers: SeriousBroker.it  This test is not yet approved or cleared by the [place] FDA and has been authorized for detection and/or diagnosis of SARS-CoV-2 by FDA under an Emergency Use Authorization (EUA). This EUA will remain in effect (meaning this test can be used) for the duration of the COVID-19 declaration under Section 564(b)(1) of the Act, 21 U.S.C. section 360bbb-3(b)(1), unless the authorization is terminated or revoked.  Performed at [facility], [address] [place], [place] [address]   Group A Strep by PCR     Status: None   Collection Time: 09/30/23  3:36 PM   Specimen: Throat; Sterile Swab  Result Value Ref Range Status   Group A Strep by PCR NOT DETECTED NOT DETECTED Final    Comment: Performed at [facility], [address] [place], [place] [address]  Respiratory (~20 pathogens) panel by PCR     Status: None   Collection Time: 09/30/23  3:38 PM   Specimen: Nasopharyngeal Swab; Respiratory  Result Value Ref Range Status   Adenovirus NOT DETECTED NOT DETECTED Final   Coronavirus 229E NOT DETECTED NOT DETECTED Final    Comment: (NOTE) The Coronavirus on the Respiratory Panel, DOES NOT test for the novel  Coronavirus (2019 nCoV)    Coronavirus HKU1 NOT DETECTED NOT DETECTED Final   Coronavirus NL63 NOT DETECTED NOT DETECTED Final   Coronavirus OC43 NOT DETECTED NOT DETECTED Final   Metapneumovirus NOT DETECTED NOT DETECTED Final   Rhinovirus / Enterovirus NOT DETECTED NOT DETECTED Final   Influenza A NOT DETECTED NOT DETECTED Final   Influenza B NOT DETECTED NOT DETECTED Final   Parainfluenza Virus 1 NOT DETECTED NOT DETECTED  Final   Parainfluenza Virus 2 NOT DETECTED NOT DETECTED Final   Parainfluenza Virus 3 NOT DETECTED NOT DETECTED Final   Parainfluenza Virus 4 NOT DETECTED NOT DETECTED Final   Respiratory Syncytial Virus NOT DETECTED NOT DETECTED Final   Bordetella pertussis NOT DETECTED NOT DETECTED Final   Bordetella Parapertussis NOT DETECTED NOT DETECTED Final   Chlamydophila pneumoniae NOT DETECTED NOT DETECTED Final   Mycoplasma pneumoniae NOT DETECTED NOT DETECTED Final    Comment: Performed at [facility], [address] [place], [place] [address]    BCID Results: 1 (anaerobic) of 4 bottles with Staph Epi, no resistance.  Pt currently on Unasyn for Otitis media.    Name of provider contacted: Cliffton Asters, NP   Changes to prescribed antibiotics required: Transition pt to Cefazolin.  Otelia Sergeant, PharmD, [facility] 10/01/2023 3:43 AM

## 2023-10-01 NOTE — Plan of Care (Signed)

## 2023-10-01 NOTE — Progress Notes (Signed)
PROGRESS NOTE    Emma Hall  UYQ:[id number] DOB: 1966-02-02 DOA: 09/30/2023 PCP: Aliene Beams, MD   Assessment & Plan:   Principal Problem:   Fever of unknown origin Active Problems:   DDD (degenerative disc disease), lumbosacral   Irregular heart beat   Leukocytosis   Right otitis media   Truncal obesity  Assessment and Plan: Fever: of unknown origin. Possibly secondary to otitis media. Resp viral panel is neg.   ? Bacteremia: blood cxs growing staph epi. Likely a containment. Repeat blood cx tomorrow. Continue on IV cefazolin  Headache: fioricet prn   Obesity: BMI 31.9. Would benefit from weight loss.    Right otitis media: continue on IV cefazolin    Leukocytosis: resolved    Arrhythmia: etiology unclear, possibly atrial tachycardia. Metoprolol prn       DVT prophylaxis: heparin  Code Status: full  Family Communication:  Disposition Plan: likely d/c back home   Level of care: Telemetry Medical Status is: Inpatient Remains inpatient appropriate because: severity of illness    Consultants:    Procedures:   Antimicrobials: cefazolin    Subjective: Pt c/o headache   Objective: Vitals:   10/01/23 0015 10/01/23 0130 10/01/23 0133 10/01/23 0135  BP: (!) 144/77   (!) 127/90  Pulse: 84 85  90  Resp: 16 12  14   Temp:   98.2 F (36.8 C)   TempSrc:   Oral   SpO2: 98% 97%  99%  Weight:      Height:        Intake/Output Summary (Last 24 hours) at 10/01/2023 0814 Last data filed at 09/30/2023 1800 Gross per 24 hour  Intake 2901.58 ml  Output --  Net 2901.58 ml   Filed Weights   09/30/23 1117  Weight: 92.5 kg    Examination:  General exam: Appears calm but uncomfortable  Respiratory system: Clear to auscultation. Respiratory effort normal. Cardiovascular system: S1 & S2+. No rubs, gallops or clicks.  Gastrointestinal system: Abdomen is nondistended, soft and nontender. Normal bowel sounds heard. Central nervous system: Alert and  oriented. Moves all extremities  Psychiatry: Judgement and insight appear normal. Mood & affect appropriate.     Data Reviewed: I have personally reviewed following labs and imaging studies  CBC: Recent Labs  Lab 09/30/23 1119 10/01/23 0505  WBC 13.7* 10.1  NEUTROABS 10.7*  --   HGB 12.2 10.6*  HCT 37.5 31.6*  MCV 84.7 81.7  PLT 389 356   Basic Metabolic Panel: Recent Labs  Lab 09/30/23 1119 10/01/23 0505  NA 137 138  K 3.7 3.4*  CL 101 103  CO2 26 27  GLUCOSE 110* 99  BUN 11 11  CREATININE 0.96 0.98  CALCIUM 9.2 8.7*  MG 2.2  --    GFR: Estimated Creatinine Clearance: 74.9 mL/min (by C-G formula based on SCr of 0.98 mg/dL). Liver Function Tests: Recent Labs  Lab 09/30/23 1119  AST 16  ALT 15  ALKPHOS 87  BILITOT 0.6  PROT 8.0  ALBUMIN 4.3   No results for input(s): "LIPASE", "AMYLASE" in the last 168 hours. No results for input(s): "AMMONIA" in the last 168 hours. Coagulation Profile: Recent Labs  Lab 09/30/23 1119  INR 1.1   Cardiac Enzymes: No results for input(s): "CKTOTAL", "CKMB", "CKMBINDEX", "TROPONINI" in the last 168 hours. BNP (last 3 results) No results for input(s): "PROBNP" in the last 8760 hours. HbA1C: No results for input(s): "HGBA1C" in the last 72 hours. CBG: No results for input(s): "GLUCAP" in the  last 168 hours. Lipid Profile: No results for input(s): "CHOL", "HDL", "LDLCALC", "TRIG", "CHOLHDL", "LDLDIRECT" in the last 72 hours. Thyroid Function Tests: Recent Labs    09/30/23 1119  TSH 0.763  FREET4 0.80   Anemia Panel: No results for input(s): "VITAMINB12", "FOLATE", "FERRITIN", "TIBC", "IRON", "RETICCTPCT" in the last 72 hours. Sepsis Labs: Recent Labs  Lab 09/30/23 1119  PROCALCITON <0.10  LATICACIDVEN 0.9    Recent Results (from the past 240 hour(s))  Culture, blood (Routine x 2)     Status: None (Preliminary result)   Collection Time: 09/30/23 11:19 AM   Specimen: BLOOD  Result Value Ref Range Status    Specimen Description BLOOD RIGHT ANTECUBITAL  Final   Special Requests   Final    BOTTLES DRAWN AEROBIC AND ANAEROBIC Blood Culture results may not be optimal due to an inadequate volume of blood received in culture bottles   Culture  Setup Time   Final    GRAM POSITIVE COCCI ANAEROBIC BOTTLE ONLY Organism ID to follow CRITICAL RESULT CALLED TO, READ BACK BY AND VERIFIED WITHDawayne Cirri @ 0307 10/01/23 LFD Performed at [facility], [address] [place], [place] [address]    Culture GRAM POSITIVE COCCI  Final   Report Status PENDING  Incomplete  Blood Culture ID Panel (Reflexed)     Status: Abnormal   Collection Time: 09/30/23 11:19 AM  Result Value Ref Range Status   Enterococcus faecalis NOT DETECTED NOT DETECTED Final   Enterococcus Faecium NOT DETECTED NOT DETECTED Final   Listeria monocytogenes NOT DETECTED NOT DETECTED Final   Staphylococcus species DETECTED (A) NOT DETECTED Final    Comment: CRITICAL RESULT CALLED TO, READ BACK BY AND VERIFIED WITH: NATHAN BELUE @ 0307 10/01/23 LFD    Staphylococcus aureus (BCID) NOT DETECTED NOT DETECTED Final   Staphylococcus epidermidis DETECTED (A) NOT DETECTED Final    Comment: CRITICAL RESULT CALLED TO, READ BACK BY AND VERIFIED WITH: NATHAN BELUE @0307  10/01/23 LFD    Staphylococcus lugdunensis NOT DETECTED NOT DETECTED Final   Streptococcus species NOT DETECTED NOT DETECTED Final   Streptococcus agalactiae NOT DETECTED NOT DETECTED Final   Streptococcus pneumoniae NOT DETECTED NOT DETECTED Final   Streptococcus pyogenes NOT DETECTED NOT DETECTED Final   A.calcoaceticus-baumannii NOT DETECTED NOT DETECTED Final   Bacteroides fragilis NOT DETECTED NOT DETECTED Final   Enterobacterales NOT DETECTED NOT DETECTED Final   Enterobacter cloacae complex NOT DETECTED NOT DETECTED Final   Escherichia coli NOT DETECTED NOT DETECTED Final   Klebsiella aerogenes NOT DETECTED NOT DETECTED Final   Klebsiella oxytoca NOT DETECTED  NOT DETECTED Final   Klebsiella pneumoniae NOT DETECTED NOT DETECTED Final   Proteus species NOT DETECTED NOT DETECTED Final   Salmonella species NOT DETECTED NOT DETECTED Final   Serratia marcescens NOT DETECTED NOT DETECTED Final   Haemophilus influenzae NOT DETECTED NOT DETECTED Final   Neisseria meningitidis NOT DETECTED NOT DETECTED Final   Pseudomonas aeruginosa NOT DETECTED NOT DETECTED Final   Stenotrophomonas maltophilia NOT DETECTED NOT DETECTED Final   Candida albicans NOT DETECTED NOT DETECTED Final   Candida auris NOT DETECTED NOT DETECTED Final   Candida glabrata NOT DETECTED NOT DETECTED Final   Candida krusei NOT DETECTED NOT DETECTED Final   Candida parapsilosis NOT DETECTED NOT DETECTED Final   Candida tropicalis NOT DETECTED NOT DETECTED Final   Cryptococcus neoformans/gattii NOT DETECTED NOT DETECTED Final   Methicillin resistance mecA/C NOT DETECTED NOT DETECTED Final    Comment: Performed at [facility], [address], [place], [place] [address]  Resp panel by RT-PCR (RSV, Flu A&B, Covid) Anterior Nasal Swab     Status: None   Collection Time: 09/30/23 11:50 AM   Specimen: Anterior Nasal Swab  Result Value Ref Range Status   SARS Coronavirus 2 by RT PCR NEGATIVE NEGATIVE Final    Comment: (NOTE) SARS-CoV-2 target nucleic acids are NOT DETECTED.  The SARS-CoV-2 RNA is generally detectable in upper respiratory specimens during the acute phase of infection. The lowest concentration of SARS-CoV-2 viral copies this assay can detect is 138 copies/mL. A negative result does not preclude SARS-Cov-2 infection and should not be used as the sole basis for treatment or other patient management decisions. A negative result may occur with  improper specimen collection/handling, submission of specimen other than nasopharyngeal swab, presence of viral mutation(s) within the areas targeted by this assay, and inadequate number of viral copies(<138 copies/mL). A  negative result must be combined with clinical observations, patient history, and epidemiological information. The expected result is Negative.  Fact Sheet for Patients:  BloggerCourse.com  Fact Sheet for Healthcare Providers:  SeriousBroker.it  This test is no t yet approved or cleared by the [place] FDA and  has been authorized for detection and/or diagnosis of SARS-CoV-2 by FDA under an Emergency Use Authorization (EUA). This EUA will remain  in effect (meaning this test can be used) for the duration of the COVID-19 declaration under Section 564(b)(1) of the Act, 21 U.S.C.section 360bbb-3(b)(1), unless the authorization is terminated  or revoked sooner.       Influenza A by PCR NEGATIVE NEGATIVE Final   Influenza B by PCR NEGATIVE NEGATIVE Final    Comment: (NOTE) The Xpert Xpress SARS-CoV-2/FLU/RSV plus assay is intended as an aid in the diagnosis of influenza from Nasopharyngeal swab specimens and should not be used as a sole basis for treatment. Nasal washings and aspirates are unacceptable for Xpert Xpress SARS-CoV-2/FLU/RSV testing.  Fact Sheet for Patients: BloggerCourse.com  Fact Sheet for Healthcare Providers: SeriousBroker.it  This test is not yet approved or cleared by the [place] FDA and has been authorized for detection and/or diagnosis of SARS-CoV-2 by FDA under an Emergency Use Authorization (EUA). This EUA will remain in effect (meaning this test can be used) for the duration of the COVID-19 declaration under Section 564(b)(1) of the Act, 21 U.S.C. section 360bbb-3(b)(1), unless the authorization is terminated or revoked.     Resp Syncytial Virus by PCR NEGATIVE NEGATIVE Final    Comment: (NOTE) Fact Sheet for Patients: BloggerCourse.com  Fact Sheet for Healthcare  Providers: SeriousBroker.it  This test is not yet approved or cleared by the [place] FDA and has been authorized for detection and/or diagnosis of SARS-CoV-2 by FDA under an Emergency Use Authorization (EUA). This EUA will remain in effect (meaning this test can be used) for the duration of the COVID-19 declaration under Section 564(b)(1) of the Act, 21 U.S.C. section 360bbb-3(b)(1), unless the authorization is terminated or revoked.  Performed at [facility], [address] [place], [place] [address]   Group A Strep by PCR     Status: None   Collection Time: 09/30/23  3:36 PM   Specimen: Throat; Sterile Swab  Result Value Ref Range Status   Group A Strep by PCR NOT DETECTED NOT DETECTED Final    Comment: Performed at [facility], [address] [place], [place] [address]  Respiratory (~20 pathogens) panel by PCR     Status:  None   Collection Time: 09/30/23  3:38 PM   Specimen: Nasopharyngeal Swab; Respiratory  Result Value Ref Range Status   Adenovirus NOT DETECTED NOT DETECTED Final   Coronavirus 229E NOT DETECTED NOT DETECTED Final    Comment: (NOTE) The Coronavirus on the Respiratory Panel, DOES NOT test for the novel  Coronavirus (2019 nCoV)    Coronavirus HKU1 NOT DETECTED NOT DETECTED Final   Coronavirus NL63 NOT DETECTED NOT DETECTED Final   Coronavirus OC43 NOT DETECTED NOT DETECTED Final   Metapneumovirus NOT DETECTED NOT DETECTED Final   Rhinovirus / Enterovirus NOT DETECTED NOT DETECTED Final   Influenza A NOT DETECTED NOT DETECTED Final   Influenza B NOT DETECTED NOT DETECTED Final   Parainfluenza Virus 1 NOT DETECTED NOT DETECTED Final   Parainfluenza Virus 2 NOT DETECTED NOT DETECTED Final   Parainfluenza Virus 3 NOT DETECTED NOT DETECTED Final   Parainfluenza Virus 4 NOT DETECTED NOT DETECTED Final   Respiratory Syncytial Virus NOT DETECTED NOT DETECTED Final   Bordetella pertussis NOT DETECTED NOT  DETECTED Final   Bordetella Parapertussis NOT DETECTED NOT DETECTED Final   Chlamydophila pneumoniae NOT DETECTED NOT DETECTED Final   Mycoplasma pneumoniae NOT DETECTED NOT DETECTED Final    Comment: Performed at [facility], [address], [place], [place] [address]         Radiology Studies: DG Chest 2 View  Result Date: 09/30/2023 CLINICAL DATA:  57 year old female with suspected sepsis. EXAM: CHEST - 2 VIEW COMPARISON:  No priors. FINDINGS: The heart size and mediastinal contours are within normal limits. Both lungs are clear. The visualized skeletal structures are unremarkable. IMPRESSION: No active cardiopulmonary disease. Electronically Signed   By: Trudie Reed M.D.   On: 09/30/2023 12:36        Scheduled Meds:  heparin  5,000 Units Subcutaneous Q8H   Continuous Infusions:   ceFAZolin (ANCEF) IV       LOS: 1 day      Charise Killian, MD [facility] Pager [phone]  If 7PM-7AM, please contact night-coverage [email or web address] 10/01/2023, 8:14 AM

## 2023-10-01 NOTE — Plan of Care (Signed)

## 2023-10-02 DIAGNOSIS — R509 Fever, unspecified: Secondary | ICD-10-CM | POA: Diagnosis not present

## 2023-10-02 LAB — CBC
HCT: 34.7 % — ABNORMAL LOW (ref 36.0–46.0)
Hemoglobin: 11.6 g/dL — ABNORMAL LOW (ref 12.0–15.0)
MCH: 28.2 pg (ref 26.0–34.0)
MCHC: 33.4 g/dL (ref 30.0–36.0)
MCV: 84.2 fL (ref 80.0–100.0)
Platelets: 375 10*3/uL (ref 150–400)
RBC: 4.12 MIL/uL (ref 3.87–5.11)
RDW: 13.8 % (ref 11.5–15.5)
WBC: 8.9 10*3/uL (ref 4.0–10.5)
nRBC: 0 % (ref 0.0–0.2)

## 2023-10-02 LAB — BASIC METABOLIC PANEL
Anion gap: 11 (ref 5–15)
BUN: 14 mg/dL (ref 6–20)
CO2: 27 mmol/L (ref 22–32)
Calcium: 9.3 mg/dL (ref 8.9–10.3)
Chloride: 102 mmol/L (ref 98–111)
Creatinine, Ser: 0.93 mg/dL (ref 0.44–1.00)
GFR, Estimated: >60 mL/min (ref >60)
Glucose, Bld: 101 mg/dL — ABNORMAL HIGH (ref 70–99)
Potassium: 3.5 mmol/L (ref 3.5–5.1)
Sodium: 140 mmol/L (ref 135–145)

## 2023-10-02 NOTE — Discharge Instructions (Signed)
Your nurse navigator can be reached at [phone]

## 2023-10-02 NOTE — Progress Notes (Signed)
PROGRESS NOTE    Emma Hall  GUY:[id number] DOB: 1966/02/09 DOA: 09/30/2023 PCP: Aliene Beams, MD   Assessment & Plan:   Principal Problem:   Fever of unknown origin Active Problems:   DDD (degenerative disc disease), lumbosacral   Irregular heart beat   Leukocytosis   Right otitis media   Truncal obesity  Assessment and Plan: Fever: of unknown origin. Possibly secondary to otitis media. Resp viral panel is neg. No fever today so far   ? Bacteremia: blood cxs growing staph epi. Likely a containment. Repeat blood cxs today. Continue on IV unasyn   Headache: some improvement. Fioricet prn   Obesity: BMI 31.9. Would benefit from weight loss    Right otitis media: continue on IV unasyn    Leukocytosis: resolved    Arrhythmia: etiology unclear, possibly atrial tachycardia. Metoprolol prn       DVT prophylaxis: heparin  Code Status: full  Family Communication:  Disposition Plan: likely d/c back home   Level of care: Telemetry Medical Status is: Inpatient Remains inpatient appropriate because: severity of illness    Consultants:    Procedures:   Antimicrobials: unasyn   Subjective: Pt c/o sinus congestion   Objective: Vitals:   10/01/23 1620 10/01/23 1940 10/02/23 0442 10/02/23 0733  BP: 136/85 134/75 125/73 137/86  Pulse: 85 87 77 79  Resp: 18 18  17   Temp: 99.6 F (37.6 C) 98.4 F (36.9 C) 98.3 F (36.8 C) 98.5 F (36.9 C)  TempSrc:      SpO2: 99% 95% 97% 100%  Weight:      Height:        Intake/Output Summary (Last 24 hours) at 10/02/2023 0837 Last data filed at 10/02/2023 0543 Gross per 24 hour  Intake 560 ml  Output --  Net 560 ml   Filed Weights   09/30/23 1117  Weight: 92.5 kg    Examination:  General exam: appears calm but uncomfortable  Respiratory system: clear breath sounds b/l  Cardiovascular system: S1/S2+. No rubs or gallops   Gastrointestinal system: abd is soft, NT, obese & normal bowel sounds  Central  nervous system: alert & oriented. Moves all extremities  Psychiatry: judgement and insight appears normal. Appropriate mood and affect.     Data Reviewed: I have personally reviewed following labs and imaging studies  CBC: Recent Labs  Lab 09/30/23 1119 10/01/23 0505 10/02/23 0539  WBC 13.7* 10.1 8.9  NEUTROABS 10.7*  --   --   HGB 12.2 10.6* 11.6*  HCT 37.5 31.6* 34.7*  MCV 84.7 81.7 84.2  PLT 389 356 375   Basic Metabolic Panel: Recent Labs  Lab 09/30/23 1119 10/01/23 0505 10/02/23 0539  NA 137 138 140  K 3.7 3.4* 3.5  CL 101 103 102  CO2 26 27 27   GLUCOSE 110* 99 101*  BUN 11 11 14   CREATININE 0.96 0.98 0.93  CALCIUM 9.2 8.7* 9.3  MG 2.2  --   --    GFR: Estimated Creatinine Clearance: 78.9 mL/min (by C-G formula based on SCr of 0.93 mg/dL). Liver Function Tests: Recent Labs  Lab 09/30/23 1119  AST 16  ALT 15  ALKPHOS 87  BILITOT 0.6  PROT 8.0  ALBUMIN 4.3   No results for input(s): "LIPASE", "AMYLASE" in the last 168 hours. No results for input(s): "AMMONIA" in the last 168 hours. Coagulation Profile: Recent Labs  Lab 09/30/23 1119  INR 1.1   Cardiac Enzymes: No results for input(s): "CKTOTAL", "CKMB", "CKMBINDEX", "TROPONINI" in the last  168 hours. BNP (last 3 results) No results for input(s): "PROBNP" in the last 8760 hours. HbA1C: No results for input(s): "HGBA1C" in the last 72 hours. CBG: No results for input(s): "GLUCAP" in the last 168 hours. Lipid Profile: No results for input(s): "CHOL", "HDL", "LDLCALC", "TRIG", "CHOLHDL", "LDLDIRECT" in the last 72 hours. Thyroid Function Tests: Recent Labs    09/30/23 1119  TSH 0.763  FREET4 0.80   Anemia Panel: No results for input(s): "VITAMINB12", "FOLATE", "FERRITIN", "TIBC", "IRON", "RETICCTPCT" in the last 72 hours. Sepsis Labs: Recent Labs  Lab 09/30/23 1119  PROCALCITON <0.10  LATICACIDVEN 0.9    Recent Results (from the past 240 hour(s))  Culture, blood (Routine x 2)      Status: None (Preliminary result)   Collection Time: 09/30/23 11:19 AM   Specimen: BLOOD  Result Value Ref Range Status   Specimen Description   Final    BLOOD RIGHT ANTECUBITAL Performed at [facility], [address], [place], [place] [address]    Special Requests   Final    BOTTLES DRAWN AEROBIC AND ANAEROBIC Blood Culture results may not be optimal due to an inadequate volume of blood received in culture bottles Performed at [facility], [address] [place], [place] [address]    Culture  Setup Time   Final    GRAM POSITIVE COCCI ANAEROBIC BOTTLE ONLY CRITICAL RESULT CALLED TO, READ BACK BY AND VERIFIED WITH: NATHAN BELUE @ 8841 10/01/23 LFD    Culture GRAM POSITIVE COCCI  Final   Report Status PENDING  Incomplete  Blood Culture ID Panel (Reflexed)     Status: Abnormal   Collection Time: 09/30/23 11:19 AM  Result Value Ref Range Status   Enterococcus faecalis NOT DETECTED NOT DETECTED Final   Enterococcus Faecium NOT DETECTED NOT DETECTED Final   Listeria monocytogenes NOT DETECTED NOT DETECTED Final   Staphylococcus species DETECTED (A) NOT DETECTED Final    Comment: CRITICAL RESULT CALLED TO, READ BACK BY AND VERIFIED WITH: NATHAN BELUE @ 0307 10/01/23 LFD    Staphylococcus aureus (BCID) NOT DETECTED NOT DETECTED Final   Staphylococcus epidermidis DETECTED (A) NOT DETECTED Final    Comment: CRITICAL RESULT CALLED TO, READ BACK BY AND VERIFIED WITH: NATHAN BELUE @0307  10/01/23 LFD    Staphylococcus lugdunensis NOT DETECTED NOT DETECTED Final   Streptococcus species NOT DETECTED NOT DETECTED Final   Streptococcus agalactiae NOT DETECTED NOT DETECTED Final   Streptococcus pneumoniae NOT DETECTED NOT DETECTED Final   Streptococcus pyogenes NOT DETECTED NOT DETECTED Final   A.calcoaceticus-baumannii NOT DETECTED NOT DETECTED Final   Bacteroides fragilis NOT DETECTED NOT DETECTED Final   Enterobacterales NOT DETECTED NOT DETECTED Final    Enterobacter cloacae complex NOT DETECTED NOT DETECTED Final   Escherichia coli NOT DETECTED NOT DETECTED Final   Klebsiella aerogenes NOT DETECTED NOT DETECTED Final   Klebsiella oxytoca NOT DETECTED NOT DETECTED Final   Klebsiella pneumoniae NOT DETECTED NOT DETECTED Final   Proteus species NOT DETECTED NOT DETECTED Final   Salmonella species NOT DETECTED NOT DETECTED Final   Serratia marcescens NOT DETECTED NOT DETECTED Final   Haemophilus influenzae NOT DETECTED NOT DETECTED Final   Neisseria meningitidis NOT DETECTED NOT DETECTED Final   Pseudomonas aeruginosa NOT DETECTED NOT DETECTED Final   Stenotrophomonas maltophilia NOT DETECTED NOT DETECTED Final   Candida albicans NOT DETECTED NOT DETECTED Final   Candida auris NOT DETECTED NOT DETECTED Final   Candida glabrata NOT DETECTED NOT DETECTED Final   Candida krusei  NOT DETECTED NOT DETECTED Final   Candida parapsilosis NOT DETECTED NOT DETECTED Final   Candida tropicalis NOT DETECTED NOT DETECTED Final   Cryptococcus neoformans/gattii NOT DETECTED NOT DETECTED Final   Methicillin resistance mecA/C NOT DETECTED NOT DETECTED Final    Comment: Performed at [facility], [address] [place], [place] [address]  Culture, blood (Routine x 2)     Status: None (Preliminary result)   Collection Time: 09/30/23 11:50 AM   Specimen: BLOOD  Result Value Ref Range Status   Specimen Description BLOOD LEFT ANTECUBITAL  Final   Special Requests   Final    BOTTLES DRAWN AEROBIC AND ANAEROBIC Blood Culture adequate volume   Culture   Final    NO GROWTH 2 DAYS Performed at [facility], [address], [place], [place] [address]    Report Status PENDING  Incomplete  Resp panel by RT-PCR (RSV, Flu A&B, Covid) Anterior Nasal Swab     Status: None   Collection Time: 09/30/23 11:50 AM   Specimen: Anterior Nasal Swab  Result Value Ref Range Status   SARS Coronavirus 2 by RT PCR NEGATIVE NEGATIVE Final    Comment:  (NOTE) SARS-CoV-2 target nucleic acids are NOT DETECTED.  The SARS-CoV-2 RNA is generally detectable in upper respiratory specimens during the acute phase of infection. The lowest concentration of SARS-CoV-2 viral copies this assay can detect is 138 copies/mL. A negative result does not preclude SARS-Cov-2 infection and should not be used as the sole basis for treatment or other patient management decisions. A negative result may occur with  improper specimen collection/handling, submission of specimen other than nasopharyngeal swab, presence of viral mutation(s) within the areas targeted by this assay, and inadequate number of viral copies(<138 copies/mL). A negative result must be combined with clinical observations, patient history, and epidemiological information. The expected result is Negative.  Fact Sheet for Patients:  BloggerCourse.com  Fact Sheet for Healthcare Providers:  SeriousBroker.it  This test is no t yet approved or cleared by the [place] FDA and  has been authorized for detection and/or diagnosis of SARS-CoV-2 by FDA under an Emergency Use Authorization (EUA). This EUA will remain  in effect (meaning this test can be used) for the duration of the COVID-19 declaration under Section 564(b)(1) of the Act, 21 U.S.C.section 360bbb-3(b)(1), unless the authorization is terminated  or revoked sooner.       Influenza A by PCR NEGATIVE NEGATIVE Final   Influenza B by PCR NEGATIVE NEGATIVE Final    Comment: (NOTE) The Xpert Xpress SARS-CoV-2/FLU/RSV plus assay is intended as an aid in the diagnosis of influenza from Nasopharyngeal swab specimens and should not be used as a sole basis for treatment. Nasal washings and aspirates are unacceptable for Xpert Xpress SARS-CoV-2/FLU/RSV testing.  Fact Sheet for Patients: BloggerCourse.com  Fact Sheet for Healthcare  Providers: SeriousBroker.it  This test is not yet approved or cleared by the [place] FDA and has been authorized for detection and/or diagnosis of SARS-CoV-2 by FDA under an Emergency Use Authorization (EUA). This EUA will remain in effect (meaning this test can be used) for the duration of the COVID-19 declaration under Section 564(b)(1) of the Act, 21 U.S.C. section 360bbb-3(b)(1), unless the authorization is terminated or revoked.     Resp Syncytial Virus by PCR NEGATIVE NEGATIVE Final    Comment: (NOTE) Fact Sheet for Patients: BloggerCourse.com  Fact Sheet for Healthcare Providers: SeriousBroker.it  This test is not yet approved or cleared by the [place]  FDA and has been authorized for detection and/or diagnosis of SARS-CoV-2 by FDA under an Emergency Use Authorization (EUA). This EUA will remain in effect (meaning this test can be used) for the duration of the COVID-19 declaration under Section 564(b)(1) of the Act, 21 U.S.C. section 360bbb-3(b)(1), unless the authorization is terminated or revoked.  Performed at [facility], [address] [place], [place] [address]   Group A Strep by PCR     Status: None   Collection Time: 09/30/23  3:36 PM   Specimen: Throat; Sterile Swab  Result Value Ref Range Status   Group A Strep by PCR NOT DETECTED NOT DETECTED Final    Comment: Performed at [facility], [address] [place], [place] [address]  Respiratory (~20 pathogens) panel by PCR     Status: None   Collection Time: 09/30/23  3:38 PM   Specimen: Nasopharyngeal Swab; Respiratory  Result Value Ref Range Status   Adenovirus NOT DETECTED NOT DETECTED Final   Coronavirus 229E NOT DETECTED NOT DETECTED Final    Comment: (NOTE) The Coronavirus on the Respiratory Panel, DOES NOT test for the novel  Coronavirus (2019 nCoV)    Coronavirus HKU1 NOT DETECTED NOT  DETECTED Final   Coronavirus NL63 NOT DETECTED NOT DETECTED Final   Coronavirus OC43 NOT DETECTED NOT DETECTED Final   Metapneumovirus NOT DETECTED NOT DETECTED Final   Rhinovirus / Enterovirus NOT DETECTED NOT DETECTED Final   Influenza A NOT DETECTED NOT DETECTED Final   Influenza B NOT DETECTED NOT DETECTED Final   Parainfluenza Virus 1 NOT DETECTED NOT DETECTED Final   Parainfluenza Virus 2 NOT DETECTED NOT DETECTED Final   Parainfluenza Virus 3 NOT DETECTED NOT DETECTED Final   Parainfluenza Virus 4 NOT DETECTED NOT DETECTED Final   Respiratory Syncytial Virus NOT DETECTED NOT DETECTED Final   Bordetella pertussis NOT DETECTED NOT DETECTED Final   Bordetella Parapertussis NOT DETECTED NOT DETECTED Final   Chlamydophila pneumoniae NOT DETECTED NOT DETECTED Final   Mycoplasma pneumoniae NOT DETECTED NOT DETECTED Final    Comment: Performed at [facility], [address], [place], [place] [address]         Radiology Studies: DG Chest 2 View  Result Date: 09/30/2023 CLINICAL DATA:  57 year old female with suspected sepsis. EXAM: CHEST - 2 VIEW COMPARISON:  No priors. FINDINGS: The heart size and mediastinal contours are within normal limits. Both lungs are clear. The visualized skeletal structures are unremarkable. IMPRESSION: No active cardiopulmonary disease. Electronically Signed   By: Trudie Reed M.D.   On: 09/30/2023 12:36        Scheduled Meds:  heparin  5,000 Units Subcutaneous Q8H   Continuous Infusions:  ampicillin-sulbactam (UNASYN) IV 3 g (10/02/23 0543)     LOS: 2 days      Charise Killian, MD [facility] Pager [phone]  If 7PM-7AM, please contact night-coverage [email or web address] 10/02/2023, 8:37 AM

## 2023-10-02 NOTE — Progress Notes (Signed)
Introduced patient to role of Statistician. Intake questions completed. Patient has established PCP care with Dr. Tracie Harrier. She currently lives with her parents and 56 year old grandson. She does drive to/from appointments, and denies any SDOH needs at present time. Denies need for mental health provider.

## 2023-10-02 NOTE — Plan of Care (Addendum)
Patient is alert and oriented X 4. She has been getting a antibiotics as order.  Problem: Education: Goal: Knowledge of General Education information will improve Description: Including pain rating scale, medication(s)/side effects and non-pharmacologic comfort measures Outcome: Progressing   Problem: Health Behavior/Discharge Planning: Goal: Ability to manage health-related needs will improve Outcome: Progressing   Problem: Clinical Measurements: Goal: Ability to maintain clinical measurements within normal limits will improve Outcome: Progressing Goal: Will remain free from infection Outcome: Progressing Goal: Diagnostic test results will improve Outcome: Progressing Goal: Respiratory complications will improve Outcome: Progressing Goal: Cardiovascular complication will be avoided Outcome: Progressing   Problem: Activity: Goal: Risk for activity intolerance will decrease Outcome: Progressing   Problem: Nutrition: Goal: Adequate nutrition will be maintained Outcome: Progressing   Problem: Coping: Goal: Level of anxiety will decrease Outcome: Progressing   Problem: Elimination: Goal: Will not experience complications related to bowel motility Outcome: Progressing Goal: Will not experience complications related to urinary retention Outcome: Progressing   Problem: Pain Management: Goal: General experience of comfort will improve Outcome: Progressing   Problem: Safety: Goal: Ability to remain free from injury will improve Outcome: Progressing   Problem: Skin Integrity: Goal: Risk for impaired skin integrity will decrease Outcome: Progressing

## 2023-10-03 DIAGNOSIS — R509 Fever, unspecified: Secondary | ICD-10-CM | POA: Diagnosis not present

## 2023-10-03 LAB — CBC
HCT: 35.3 % — ABNORMAL LOW (ref 36.0–46.0)
Hemoglobin: 11.7 g/dL — ABNORMAL LOW (ref 12.0–15.0)
MCH: 27.1 pg (ref 26.0–34.0)
MCHC: 33.1 g/dL (ref 30.0–36.0)
MCV: 81.9 fL (ref 80.0–100.0)
Platelets: 404 10*3/uL — ABNORMAL HIGH (ref 150–400)
RBC: 4.31 MIL/uL (ref 3.87–5.11)
RDW: 13.7 % (ref 11.5–15.5)
WBC: 9.2 10*3/uL (ref 4.0–10.5)
nRBC: 0 % (ref 0.0–0.2)

## 2023-10-03 LAB — BASIC METABOLIC PANEL
Anion gap: 7 (ref 5–15)
BUN: 13 mg/dL (ref 6–20)
CO2: 27 mmol/L (ref 22–32)
Calcium: 8.8 mg/dL — ABNORMAL LOW (ref 8.9–10.3)
Chloride: 106 mmol/L (ref 98–111)
Creatinine, Ser: 0.93 mg/dL (ref 0.44–1.00)
GFR, Estimated: >60 mL/min (ref >60)
Glucose, Bld: 101 mg/dL — ABNORMAL HIGH (ref 70–99)
Potassium: 3.7 mmol/L (ref 3.5–5.1)
Sodium: 140 mmol/L (ref 135–145)

## 2023-10-03 LAB — CULTURE, BLOOD (ROUTINE X 2)

## 2023-10-03 MED ORDER — BUTALBITAL-APAP-CAFFEINE 50-325-40 MG PO TABS: 1.0000 | ORAL_TABLET | Freq: Four times a day (QID) | ORAL | 0 refills | Status: AC | PRN

## 2023-10-03 MED ORDER — INFLUENZA VIRUS VACC SPLIT PF (FLUZONE) 0.5 ML IM SUSY: 0.5000 mL | PREFILLED_SYRINGE | INTRAMUSCULAR | Status: DC

## 2023-10-03 MED ORDER — AMOXICILLIN-POT CLAVULANATE 875-125 MG PO TABS: 1.0000 | ORAL_TABLET | Freq: Two times a day (BID) | ORAL | 0 refills | Status: AC

## 2023-10-03 NOTE — Discharge Summary (Signed)
Physician Discharge Summary  Teddie Helvey OZH:[id number] DOB: [birth year] DOA: 09/30/2023  PCP: Aliene Beams, MD  Admit date: 09/30/2023 Discharge date: 10/03/2023  Admitted From: home  Disposition: home   Recommendations for Outpatient Follow-up:  Follow up with PCP in 1-2 weeks   Home Health: no  Equipment/Devices:  Discharge Condition: stable  CODE STATUS: full  Diet recommendation: regular   Brief/Interim Summary: HPI was taken from Dr. Sedalia Muta: Ms. Emma Hall is a 57 year old female with history of depression, history of avascular necrosis, bilateral carpal tunnel syndrome, history of restless leg syndrome, leg cramping, who presents to the emergency department from outpatient clinic for chief concerns of fevers of unknown origin and sinus tachycardia.   Tmax showed temperature of 100.7, respiration rate of 26, heart rate initially 140 and improved to 92, blood pressure 148/93, improved to 136/90, SpO2 of 100% on room air.   Serum sodium is 137, potassium 3.7, chloride 101, bicarb 26, BUN of 11, serum creatinine of 0.96, EGFR greater than 60, nonfasting blood glucose 110, WBC 13.7, hemoglobin 12.2, platelets of 389.  UA was negative for leukocytes and negative for nitrates.   COVID/influenza A/influenza B/RSV PCR were negative.   ED treatment: Metoprolol 50 mg IV one-time dose, cefepime 2 g IV, Flagyl, vancomycin, LR 2 L bolus were ordered --------------------------------- At bedside, patient able to tell me her name, age, location, current, the year.   She reports that over the last 4 to 5 days she has been having a right-sided headache along with increased congestion and decreased hearing.   She just assumed that she is aging in terms of the hearing.  The hearing is worse on the right side.   She denies fever, cough, nausea, vomiting, chest pain, shortness of breath, abdominal pain, dysuria, hematuria, diarrhea.   Discharge Diagnoses:  Principal Problem:   Fever  of unknown origin Active Problems:   DDD (degenerative disc disease), lumbosacral   Irregular heart beat   Leukocytosis   Right otitis media   Truncal obesity  Fever: of unknown origin. Possibly secondary to otitis media. Resp viral panel is neg. No fever today so far. D/c home w/ augmentin to complete the course    R/o bacteremia: blood cxs growing staph epi. Likely a containment. Repeat blood cxs NGTD.    Headache: some improvement. Fioricet prn    Obesity: BMI 31.9. Would benefit from weight loss    Right otitis media: continue on IV unasyn while inpatient and d/c on po augmentin    Leukocytosis: resolved    Arrhythmia: etiology unclear, possibly atrial tachycardia. Metoprolol prn   Discharge Instructions  Discharge Instructions     Diet general   Complete by: As directed    Discharge instructions   Complete by: As directed    F/u w/ PCP in 1-2 weeks   Increase activity slowly   Complete by: As directed       Allergies as of 10/03/2023   No Known Allergies      Medication List     TAKE these medications    amoxicillin-clavulanate 875-125 MG tablet Commonly known as: AUGMENTIN Take 1 tablet by mouth 2 (two) times daily for 4 days.   butalbital-acetaminophen-caffeine 50-325-40 MG tablet Commonly known as: FIORICET Take 1 tablet by mouth every 6 (six) hours as needed for headache or migraine.   cyanocobalamin 1000 MCG tablet Commonly known as: VITAMIN B12 Take 1,000 mcg by mouth daily.   escitalopram 5 MG tablet Commonly known as: LEXAPRO Take 5 mg  by mouth daily.   ferrous sulfate 325 (65 FE) MG EC tablet Take 325 mg by mouth 3 (three) times daily with meals.   Linzess 145 MCG Caps capsule Generic drug: linaclotide TAKE 1 CAPSULE(145 MCG) BY MOUTH DAILY BEFORE BREAKFAST   MULTI VITAMIN DAILY PO Take by mouth.   oxyCODONE-acetaminophen 5-325 MG tablet Commonly known as: PERCOCET/ROXICET Take 1-2 tablets by mouth every 6 (six) hours as needed  for severe pain.        Follow-up Information     Aliene Beams, MD Follow up.   Specialty: Family Medicine Why: Hospital follow up Contact information: [address] [place] [address] [phone]                No Known Allergies  Consultations:   Procedures/Studies: DG Chest 2 View  Result Date: 09/30/2023 CLINICAL DATA:  57 year old female with suspected sepsis. EXAM: CHEST - 2 VIEW COMPARISON:  No priors. FINDINGS: The heart size and mediastinal contours are within normal limits. Both lungs are clear. The visualized skeletal structures are unremarkable. IMPRESSION: No active cardiopulmonary disease. Electronically Signed   By: Trudie Reed M.D.   On: 09/30/2023 12:36   (Echo, Carotid, EGD, Colonoscopy, ERCP)    Subjective: Pt c/o fatigue    Discharge Exam: Vitals:   10/03/23 0457 10/03/23 0751  BP: 128/80 130/77  Pulse: 76 72  Resp:  16  Temp: 98.4 F (36.9 C) 98.7 F (37.1 C)  SpO2: 100% 99%   Vitals:   10/02/23 1735 10/02/23 2059 10/03/23 0457 10/03/23 0751  BP: 130/72 134/68 128/80 130/77  Pulse: 92 80 76 72  Resp: 18 16  16   Temp: 100.1 F (37.8 C) 98.4 F (36.9 C) 98.4 F (36.9 C) 98.7 F (37.1 C)  TempSrc:      SpO2: 100% 97% 100% 99%  Weight:      Height:        General: Pt is alert, awake, not in acute distress Cardiovascular:S1/S2 +, no rubs, no gallops Respiratory: CTA bilaterally, no wheezing, no rhonchi Abdominal: Soft, NT, obese, bowel sounds + Extremities: no cyanosis    The results of significant diagnostics from this hospitalization (including imaging, microbiology, ancillary and laboratory) are listed below for reference.     Microbiology: Recent Results (from the past 240 hour(s))  Culture, blood (Routine x 2)     Status: Abnormal (Preliminary result)   Collection Time: 09/30/23 11:19 AM   Specimen: BLOOD  Result Value Ref Range Status   Specimen Description   Final    BLOOD RIGHT  ANTECUBITAL Performed at [facility], [address] [place], [place] [address]    Special Requests   Final    BOTTLES DRAWN AEROBIC AND ANAEROBIC Blood Culture results may not be optimal due to an inadequate volume of blood received in culture bottles Performed at [facility], [address] [place], [place] [address]    Culture  Setup Time   Final    GRAM POSITIVE COCCI ANAEROBIC BOTTLE ONLY CRITICAL RESULT CALLED TO, READ BACK BY AND VERIFIED WITH: NATHAN BELUE @ 6962 10/01/23 LFD    Culture (A)  Final    STAPHYLOCOCCUS HOMINIS THE SIGNIFICANCE OF ISOLATING THIS ORGANISM FROM A SINGLE SET OF BLOOD CULTURES WHEN MULTIPLE SETS ARE DRAWN IS UNCERTAIN. PLEASE NOTIFY THE MICROBIOLOGY DEPARTMENT WITHIN ONE WEEK IF SPECIATION AND SENSITIVITIES ARE REQUIRED. CULTURE REINCUBATED FOR BETTER GROWTH Performed at [facility], [address] [place], [place] [address]  Report Status PENDING  Incomplete  Blood Culture ID Panel (Reflexed)     Status: Abnormal   Collection Time: 09/30/23 11:19 AM  Result Value Ref Range Status   Enterococcus faecalis NOT DETECTED NOT DETECTED Final   Enterococcus Faecium NOT DETECTED NOT DETECTED Final   Listeria monocytogenes NOT DETECTED NOT DETECTED Final   Staphylococcus species DETECTED (A) NOT DETECTED Final    Comment: CRITICAL RESULT CALLED TO, READ BACK BY AND VERIFIED WITH: NATHAN BELUE @ 0307 10/01/23 LFD    Staphylococcus aureus (BCID) NOT DETECTED NOT DETECTED Final   Staphylococcus epidermidis DETECTED (A) NOT DETECTED Final    Comment: CRITICAL RESULT CALLED TO, READ BACK BY AND VERIFIED WITH: NATHAN BELUE @0307  10/01/23 LFD    Staphylococcus lugdunensis NOT DETECTED NOT DETECTED Final   Streptococcus species NOT DETECTED NOT DETECTED Final   Streptococcus agalactiae NOT DETECTED NOT DETECTED Final   Streptococcus pneumoniae NOT DETECTED NOT DETECTED Final   Streptococcus pyogenes NOT DETECTED NOT DETECTED  Final   A.calcoaceticus-baumannii NOT DETECTED NOT DETECTED Final   Bacteroides fragilis NOT DETECTED NOT DETECTED Final   Enterobacterales NOT DETECTED NOT DETECTED Final   Enterobacter cloacae complex NOT DETECTED NOT DETECTED Final   Escherichia coli NOT DETECTED NOT DETECTED Final   Klebsiella aerogenes NOT DETECTED NOT DETECTED Final   Klebsiella oxytoca NOT DETECTED NOT DETECTED Final   Klebsiella pneumoniae NOT DETECTED NOT DETECTED Final   Proteus species NOT DETECTED NOT DETECTED Final   Salmonella species NOT DETECTED NOT DETECTED Final   Serratia marcescens NOT DETECTED NOT DETECTED Final   Haemophilus influenzae NOT DETECTED NOT DETECTED Final   Neisseria meningitidis NOT DETECTED NOT DETECTED Final   Pseudomonas aeruginosa NOT DETECTED NOT DETECTED Final   Stenotrophomonas maltophilia NOT DETECTED NOT DETECTED Final   Candida albicans NOT DETECTED NOT DETECTED Final   Candida auris NOT DETECTED NOT DETECTED Final   Candida glabrata NOT DETECTED NOT DETECTED Final   Candida krusei NOT DETECTED NOT DETECTED Final   Candida parapsilosis NOT DETECTED NOT DETECTED Final   Candida tropicalis NOT DETECTED NOT DETECTED Final   Cryptococcus neoformans/gattii NOT DETECTED NOT DETECTED Final   Methicillin resistance mecA/C NOT DETECTED NOT DETECTED Final    Comment: Performed at [facility], [address] [place], [place] [address]  Culture, blood (Routine x 2)     Status: None (Preliminary result)   Collection Time: 09/30/23 11:50 AM   Specimen: BLOOD  Result Value Ref Range Status   Specimen Description BLOOD LEFT ANTECUBITAL  Final   Special Requests   Final    BOTTLES DRAWN AEROBIC AND ANAEROBIC Blood Culture adequate volume   Culture   Final    NO GROWTH 3 DAYS Performed at [facility], [address] [place], [place] [address]    Report Status PENDING  Incomplete  Resp panel by RT-PCR (RSV, Flu A&B, Covid) Anterior Nasal Swab     Status: None    Collection Time: 09/30/23 11:50 AM   Specimen: Anterior Nasal Swab  Result Value Ref Range Status   SARS Coronavirus 2 by RT PCR NEGATIVE NEGATIVE Final    Comment: (NOTE) SARS-CoV-2 target nucleic acids are NOT DETECTED.  The SARS-CoV-2 RNA is generally detectable in upper respiratory specimens during the acute phase of infection. The lowest concentration of SARS-CoV-2 viral copies this assay can detect is 138 copies/mL. A negative result does not preclude SARS-Cov-2 infection and should not be used as the sole basis for treatment or other  patient management decisions. A negative result may occur with  improper specimen collection/handling, submission of specimen other than nasopharyngeal swab, presence of viral mutation(s) within the areas targeted by this assay, and inadequate number of viral copies(<138 copies/mL). A negative result must be combined with clinical observations, patient history, and epidemiological information. The expected result is Negative.  Fact Sheet for Patients:  BloggerCourse.com  Fact Sheet for Healthcare Providers:  SeriousBroker.it  This test is no t yet approved or cleared by the [place] FDA and  has been authorized for detection and/or diagnosis of SARS-CoV-2 by FDA under an Emergency Use Authorization (EUA). This EUA will remain  in effect (meaning this test can be used) for the duration of the COVID-19 declaration under Section 564(b)(1) of the Act, 21 U.S.C.section 360bbb-3(b)(1), unless the authorization is terminated  or revoked sooner.       Influenza A by PCR NEGATIVE NEGATIVE Final   Influenza B by PCR NEGATIVE NEGATIVE Final    Comment: (NOTE) The Xpert Xpress SARS-CoV-2/FLU/RSV plus assay is intended as an aid in the diagnosis of influenza from Nasopharyngeal swab specimens and should not be used as a sole basis for treatment. Nasal washings and aspirates are unacceptable for  Xpert Xpress SARS-CoV-2/FLU/RSV testing.  Fact Sheet for Patients: BloggerCourse.com  Fact Sheet for Healthcare Providers: SeriousBroker.it  This test is not yet approved or cleared by the [place] FDA and has been authorized for detection and/or diagnosis of SARS-CoV-2 by FDA under an Emergency Use Authorization (EUA). This EUA will remain in effect (meaning this test can be used) for the duration of the COVID-19 declaration under Section 564(b)(1) of the Act, 21 U.S.C. section 360bbb-3(b)(1), unless the authorization is terminated or revoked.     Resp Syncytial Virus by PCR NEGATIVE NEGATIVE Final    Comment: (NOTE) Fact Sheet for Patients: BloggerCourse.com  Fact Sheet for Healthcare Providers: SeriousBroker.it  This test is not yet approved or cleared by the [place] FDA and has been authorized for detection and/or diagnosis of SARS-CoV-2 by FDA under an Emergency Use Authorization (EUA). This EUA will remain in effect (meaning this test can be used) for the duration of the COVID-19 declaration under Section 564(b)(1) of the Act, 21 U.S.C. section 360bbb-3(b)(1), unless the authorization is terminated or revoked.  Performed at [facility], [address] [place], [place] [address]   Group A Strep by PCR     Status: None   Collection Time: 09/30/23  3:36 PM   Specimen: Throat; Sterile Swab  Result Value Ref Range Status   Group A Strep by PCR NOT DETECTED NOT DETECTED Final    Comment: Performed at [facility], [address] [place], [place] [address]  Respiratory (~20 pathogens) panel by PCR     Status: None   Collection Time: 09/30/23  3:38 PM   Specimen: Nasopharyngeal Swab; Respiratory  Result Value Ref Range Status   Adenovirus NOT DETECTED NOT DETECTED Final   Coronavirus 229E NOT DETECTED NOT DETECTED Final    Comment:  (NOTE) The Coronavirus on the Respiratory Panel, DOES NOT test for the novel  Coronavirus (2019 nCoV)    Coronavirus HKU1 NOT DETECTED NOT DETECTED Final   Coronavirus NL63 NOT DETECTED NOT DETECTED Final   Coronavirus OC43 NOT DETECTED NOT DETECTED Final   Metapneumovirus NOT DETECTED NOT DETECTED Final   Rhinovirus / Enterovirus NOT DETECTED NOT DETECTED Final   Influenza A NOT DETECTED NOT DETECTED Final   Influenza B NOT DETECTED NOT  DETECTED Final   Parainfluenza Virus 1 NOT DETECTED NOT DETECTED Final   Parainfluenza Virus 2 NOT DETECTED NOT DETECTED Final   Parainfluenza Virus 3 NOT DETECTED NOT DETECTED Final   Parainfluenza Virus 4 NOT DETECTED NOT DETECTED Final   Respiratory Syncytial Virus NOT DETECTED NOT DETECTED Final   Bordetella pertussis NOT DETECTED NOT DETECTED Final   Bordetella Parapertussis NOT DETECTED NOT DETECTED Final   Chlamydophila pneumoniae NOT DETECTED NOT DETECTED Final   Mycoplasma pneumoniae NOT DETECTED NOT DETECTED Final    Comment: Performed at [facility], [address], [place], [place] [address]  Culture, blood (Routine X 2) w Reflex to ID Panel     Status: None (Preliminary result)   Collection Time: 10/02/23  9:08 AM   Specimen: BLOOD  Result Value Ref Range Status   Specimen Description BLOOD BLOOD LEFT FOREARM  Final   Special Requests   Final    BOTTLES DRAWN AEROBIC AND ANAEROBIC Blood Culture adequate volume   Culture   Final    NO GROWTH < 24 HOURS Performed at [facility], [address], [place], [place] [address]    Report Status PENDING  Incomplete  Culture, blood (Routine X 2) w Reflex to ID Panel     Status: None (Preliminary result)   Collection Time: 10/02/23  9:18 AM   Specimen: BLOOD  Result Value Ref Range Status   Specimen Description BLOOD BLOOD RIGHT HAND  Final   Special Requests   Final    BOTTLES DRAWN AEROBIC AND ANAEROBIC Blood Culture adequate volume   Culture   Final    NO GROWTH < 24  HOURS Performed at [facility], [address] [place], [place] [address]    Report Status PENDING  Incomplete     Labs: BNP (last 3 results) No results for input(s): "BNP" in the last 8760 hours. Basic Metabolic Panel: Recent Labs  Lab 09/30/23 1119 10/01/23 0505 10/02/23 0539 10/03/23 0409  NA 137 138 140 140  K 3.7 3.4* 3.5 3.7  CL 101 103 102 106  CO2 26 27 27 27   GLUCOSE 110* 99 101* 101*  BUN 11 11 14 13   CREATININE 0.96 0.98 0.93 0.93  CALCIUM 9.2 8.7* 9.3 8.8*  MG 2.2  --   --   --    Liver Function Tests: Recent Labs  Lab 09/30/23 1119  AST 16  ALT 15  ALKPHOS 87  BILITOT 0.6  PROT 8.0  ALBUMIN 4.3   No results for input(s): "LIPASE", "AMYLASE" in the last 168 hours. No results for input(s): "AMMONIA" in the last 168 hours. CBC: Recent Labs  Lab 09/30/23 1119 10/01/23 0505 10/02/23 0539 10/03/23 0409  WBC 13.7* 10.1 8.9 9.2  NEUTROABS 10.7*  --   --   --   HGB 12.2 10.6* 11.6* 11.7*  HCT 37.5 31.6* 34.7* 35.3*  MCV 84.7 81.7 84.2 81.9  PLT 389 356 375 404*   Cardiac Enzymes: No results for input(s): "CKTOTAL", "CKMB", "CKMBINDEX", "TROPONINI" in the last 168 hours. BNP: Invalid input(s): "POCBNP" CBG: No results for input(s): "GLUCAP" in the last 168 hours. D-Dimer No results for input(s): "DDIMER" in the last 72 hours. Hgb A1c No results for input(s): "HGBA1C" in the last 72 hours. Lipid Profile No results for input(s): "CHOL", "HDL", "LDLCALC", "TRIG", "CHOLHDL", "LDLDIRECT" in the last 72 hours. Thyroid function studies No results for input(s): "TSH", "T4TOTAL", "T3FREE", "THYROIDAB" in the last 72 hours.  Invalid input(s): "FREET3"  Anemia work up No results for input(s): "VITAMINB12", "FOLATE", "FERRITIN", "TIBC", "IRON", "RETICCTPCT" in the last 72 hours. Urinalysis    Component Value Date/Time   COLORURINE STRAW (A) 09/30/2023 1240   APPEARANCEUR CLEAR (A) 09/30/2023 1240   LABSPEC 1.005 09/30/2023 1240    PHURINE 7.0 09/30/2023 1240   GLUCOSEU NEGATIVE 09/30/2023 1240   HGBUR NEGATIVE 09/30/2023 1240   BILIRUBINUR NEGATIVE 09/30/2023 1240   KETONESUR NEGATIVE 09/30/2023 1240   PROTEINUR NEGATIVE 09/30/2023 1240   NITRITE NEGATIVE 09/30/2023 1240   LEUKOCYTESUR NEGATIVE 09/30/2023 1240   Sepsis Labs Recent Labs  Lab 09/30/23 1119 10/01/23 0505 10/02/23 0539 10/03/23 0409  WBC 13.7* 10.1 8.9 9.2   Microbiology Recent Results (from the past 240 hour(s))  Culture, blood (Routine x 2)     Status: Abnormal (Preliminary result)   Collection Time: 09/30/23 11:19 AM   Specimen: BLOOD  Result Value Ref Range Status   Specimen Description   Final    BLOOD RIGHT ANTECUBITAL Performed at [facility], [address] [place], [place] [address]    Special Requests   Final    BOTTLES DRAWN AEROBIC AND ANAEROBIC Blood Culture results may not be optimal due to an inadequate volume of blood received in culture bottles Performed at [facility], [address] [place], [place] [address]    Culture  Setup Time   Final    GRAM POSITIVE COCCI ANAEROBIC BOTTLE ONLY CRITICAL RESULT CALLED TO, READ BACK BY AND VERIFIED WITH: NATHAN BELUE @ 2952 10/01/23 LFD    Culture (A)  Final    STAPHYLOCOCCUS HOMINIS THE SIGNIFICANCE OF ISOLATING THIS ORGANISM FROM A SINGLE SET OF BLOOD CULTURES WHEN MULTIPLE SETS ARE DRAWN IS UNCERTAIN. PLEASE NOTIFY THE MICROBIOLOGY DEPARTMENT WITHIN ONE WEEK IF SPECIATION AND SENSITIVITIES ARE REQUIRED. CULTURE REINCUBATED FOR BETTER GROWTH Performed at [facility], [address], [place], [place] [address]    Report Status PENDING  Incomplete  Blood Culture ID Panel (Reflexed)     Status: Abnormal   Collection Time: 09/30/23 11:19 AM  Result Value Ref Range Status   Enterococcus faecalis NOT DETECTED NOT DETECTED Final   Enterococcus Faecium NOT DETECTED NOT DETECTED Final   Listeria monocytogenes NOT DETECTED NOT DETECTED Final    Staphylococcus species DETECTED (A) NOT DETECTED Final    Comment: CRITICAL RESULT CALLED TO, READ BACK BY AND VERIFIED WITH: NATHAN BELUE @ 0307 10/01/23 LFD    Staphylococcus aureus (BCID) NOT DETECTED NOT DETECTED Final   Staphylococcus epidermidis DETECTED (A) NOT DETECTED Final    Comment: CRITICAL RESULT CALLED TO, READ BACK BY AND VERIFIED WITH: NATHAN BELUE @0307  10/01/23 LFD    Staphylococcus lugdunensis NOT DETECTED NOT DETECTED Final   Streptococcus species NOT DETECTED NOT DETECTED Final   Streptococcus agalactiae NOT DETECTED NOT DETECTED Final   Streptococcus pneumoniae NOT DETECTED NOT DETECTED Final   Streptococcus pyogenes NOT DETECTED NOT DETECTED Final   A.calcoaceticus-baumannii NOT DETECTED NOT DETECTED Final   Bacteroides fragilis NOT DETECTED NOT DETECTED Final   Enterobacterales NOT DETECTED NOT DETECTED Final   Enterobacter cloacae complex NOT DETECTED NOT DETECTED Final   Escherichia coli NOT DETECTED NOT DETECTED Final   Klebsiella aerogenes NOT DETECTED NOT DETECTED Final   Klebsiella oxytoca NOT DETECTED NOT DETECTED Final   Klebsiella pneumoniae NOT DETECTED NOT DETECTED Final   Proteus species NOT DETECTED NOT DETECTED Final   Salmonella species NOT DETECTED NOT DETECTED Final   Serratia marcescens NOT DETECTED NOT DETECTED Final  Haemophilus influenzae NOT DETECTED NOT DETECTED Final   Neisseria meningitidis NOT DETECTED NOT DETECTED Final   Pseudomonas aeruginosa NOT DETECTED NOT DETECTED Final   Stenotrophomonas maltophilia NOT DETECTED NOT DETECTED Final   Candida albicans NOT DETECTED NOT DETECTED Final   Candida auris NOT DETECTED NOT DETECTED Final   Candida glabrata NOT DETECTED NOT DETECTED Final   Candida krusei NOT DETECTED NOT DETECTED Final   Candida parapsilosis NOT DETECTED NOT DETECTED Final   Candida tropicalis NOT DETECTED NOT DETECTED Final   Cryptococcus neoformans/gattii NOT DETECTED NOT DETECTED Final   Methicillin resistance  mecA/C NOT DETECTED NOT DETECTED Final    Comment: Performed at [facility], [address] [place], [place] [address]  Culture, blood (Routine x 2)     Status: None (Preliminary result)   Collection Time: 09/30/23 11:50 AM   Specimen: BLOOD  Result Value Ref Range Status   Specimen Description BLOOD LEFT ANTECUBITAL  Final   Special Requests   Final    BOTTLES DRAWN AEROBIC AND ANAEROBIC Blood Culture adequate volume   Culture   Final    NO GROWTH 3 DAYS Performed at [facility], [address], [place], [place] [address]    Report Status PENDING  Incomplete  Resp panel by RT-PCR (RSV, Flu A&B, Covid) Anterior Nasal Swab     Status: None   Collection Time: 09/30/23 11:50 AM   Specimen: Anterior Nasal Swab  Result Value Ref Range Status   SARS Coronavirus 2 by RT PCR NEGATIVE NEGATIVE Final    Comment: (NOTE) SARS-CoV-2 target nucleic acids are NOT DETECTED.  The SARS-CoV-2 RNA is generally detectable in upper respiratory specimens during the acute phase of infection. The lowest concentration of SARS-CoV-2 viral copies this assay can detect is 138 copies/mL. A negative result does not preclude SARS-Cov-2 infection and should not be used as the sole basis for treatment or other patient management decisions. A negative result may occur with  improper specimen collection/handling, submission of specimen other than nasopharyngeal swab, presence of viral mutation(s) within the areas targeted by this assay, and inadequate number of viral copies(<138 copies/mL). A negative result must be combined with clinical observations, patient history, and epidemiological information. The expected result is Negative.  Fact Sheet for Patients:  BloggerCourse.com  Fact Sheet for Healthcare Providers:  SeriousBroker.it  This test is no t yet approved or cleared by the [place] FDA and  has been authorized for detection  and/or diagnosis of SARS-CoV-2 by FDA under an Emergency Use Authorization (EUA). This EUA will remain  in effect (meaning this test can be used) for the duration of the COVID-19 declaration under Section 564(b)(1) of the Act, 21 U.S.C.section 360bbb-3(b)(1), unless the authorization is terminated  or revoked sooner.       Influenza A by PCR NEGATIVE NEGATIVE Final   Influenza B by PCR NEGATIVE NEGATIVE Final    Comment: (NOTE) The Xpert Xpress SARS-CoV-2/FLU/RSV plus assay is intended as an aid in the diagnosis of influenza from Nasopharyngeal swab specimens and should not be used as a sole basis for treatment. Nasal washings and aspirates are unacceptable for Xpert Xpress SARS-CoV-2/FLU/RSV testing.  Fact Sheet for Patients: BloggerCourse.com  Fact Sheet for Healthcare Providers: SeriousBroker.it  This test is not yet approved or cleared by the [place] FDA and has been authorized for detection and/or diagnosis of SARS-CoV-2 by FDA under an Emergency Use Authorization (EUA). This EUA will remain in effect (meaning this test can be used) for  the duration of the COVID-19 declaration under Section 564(b)(1) of the Act, 21 U.S.C. section 360bbb-3(b)(1), unless the authorization is terminated or revoked.     Resp Syncytial Virus by PCR NEGATIVE NEGATIVE Final    Comment: (NOTE) Fact Sheet for Patients: BloggerCourse.com  Fact Sheet for Healthcare Providers: SeriousBroker.it  This test is not yet approved or cleared by the [place] FDA and has been authorized for detection and/or diagnosis of SARS-CoV-2 by FDA under an Emergency Use Authorization (EUA). This EUA will remain in effect (meaning this test can be used) for the duration of the COVID-19 declaration under Section 564(b)(1) of the Act, 21 U.S.C. section 360bbb-3(b)(1), unless the authorization is terminated  or revoked.  Performed at [facility], [address] [place], [place] [address]   Group A Strep by PCR     Status: None   Collection Time: 09/30/23  3:36 PM   Specimen: Throat; Sterile Swab  Result Value Ref Range Status   Group A Strep by PCR NOT DETECTED NOT DETECTED Final    Comment: Performed at [facility], [address] [place], [place] [address]  Respiratory (~20 pathogens) panel by PCR     Status: None   Collection Time: 09/30/23  3:38 PM   Specimen: Nasopharyngeal Swab; Respiratory  Result Value Ref Range Status   Adenovirus NOT DETECTED NOT DETECTED Final   Coronavirus 229E NOT DETECTED NOT DETECTED Final    Comment: (NOTE) The Coronavirus on the Respiratory Panel, DOES NOT test for the novel  Coronavirus (2019 nCoV)    Coronavirus HKU1 NOT DETECTED NOT DETECTED Final   Coronavirus NL63 NOT DETECTED NOT DETECTED Final   Coronavirus OC43 NOT DETECTED NOT DETECTED Final   Metapneumovirus NOT DETECTED NOT DETECTED Final   Rhinovirus / Enterovirus NOT DETECTED NOT DETECTED Final   Influenza A NOT DETECTED NOT DETECTED Final   Influenza B NOT DETECTED NOT DETECTED Final   Parainfluenza Virus 1 NOT DETECTED NOT DETECTED Final   Parainfluenza Virus 2 NOT DETECTED NOT DETECTED Final   Parainfluenza Virus 3 NOT DETECTED NOT DETECTED Final   Parainfluenza Virus 4 NOT DETECTED NOT DETECTED Final   Respiratory Syncytial Virus NOT DETECTED NOT DETECTED Final   Bordetella pertussis NOT DETECTED NOT DETECTED Final   Bordetella Parapertussis NOT DETECTED NOT DETECTED Final   Chlamydophila pneumoniae NOT DETECTED NOT DETECTED Final   Mycoplasma pneumoniae NOT DETECTED NOT DETECTED Final    Comment: Performed at [facility], [address] [place], [place] [address]  Culture, blood (Routine X 2) w Reflex to ID Panel     Status: None (Preliminary result)   Collection Time: 10/02/23  9:08 AM   Specimen: BLOOD  Result Value Ref Range Status   Specimen  Description BLOOD BLOOD LEFT FOREARM  Final   Special Requests   Final    BOTTLES DRAWN AEROBIC AND ANAEROBIC Blood Culture adequate volume   Culture   Final    NO GROWTH < 24 HOURS Performed at [facility], [address] [place], [place] [address]    Report Status PENDING  Incomplete  Culture, blood (Routine X 2) w Reflex to ID Panel     Status: None (Preliminary result)   Collection Time: 10/02/23  9:18 AM   Specimen: BLOOD  Result Value Ref Range Status   Specimen Description BLOOD BLOOD RIGHT HAND  Final   Special Requests   Final    BOTTLES DRAWN AEROBIC AND ANAEROBIC Blood Culture adequate volume  Culture   Final    NO GROWTH < 24 HOURS Performed at [facility], [address] [place], [place] [address]    Report Status PENDING  Incomplete     Time coordinating discharge: Over 30 minutes  SIGNED:   Charise Killian, MD  [facility] 10/03/2023, 12:26 PM Pager   If 7PM-7AM, please contact night-coverage [email or web address]

## 2023-10-03 NOTE — Progress Notes (Signed)
Transition of Care [facility]) - Inpatient Brief Assessment   Patient Details  Name: Raegene Goldmann MRN: [id number] Date of Birth: 12/07/1965  Transition of Care [facility]) CM/SW Contact:    Garret Reddish, RN Phone Number: 10/03/2023, 12:34 PM   Clinical Narrative: Chart reviewed.  Patient has no identified needs.   Transition of Care Asessment: Insurance and Status: Insurance coverage has been reviewed Patient has primary care physician: Yes Tracie Harrier, Fleet Contras, MD) Home environment has been reviewed: Reviewed Prior level of function:: Independent Prior/Current Home Services: No current home services Social Determinants of Health Reivew: SDOH reviewed no interventions necessary Readmission risk has been reviewed: Yes Transition of care needs: no transition of care needs at this time

## 2023-10-03 NOTE — Plan of Care (Signed)
  Problem: Elimination: Goal: Will not experience complications related to bowel motility Outcome: Progressing Goal: Will not experience complications related to urinary retention Outcome: Progressing   Problem: Pain Management: Goal: General experience of comfort will improve Outcome: Progressing   Problem: Safety: Goal: Ability to remain free from injury will improve Outcome: Progressing   Problem: Skin Integrity: Goal: Risk for impaired skin integrity will decrease Outcome: Progressing

## 2023-10-05 LAB — CULTURE, BLOOD (ROUTINE X 2)
Culture: NO GROWTH
Special Requests: ADEQUATE

## 2023-10-07 LAB — CULTURE, BLOOD (ROUTINE X 2)
Culture: NO GROWTH
Culture: NO GROWTH
Special Requests: ADEQUATE
Special Requests: ADEQUATE

## 2023-10-18 ENCOUNTER — Encounter: Payer: Self-pay | Admitting: Family Medicine

## 2023-10-18 ENCOUNTER — Other Ambulatory Visit: Payer: Self-pay | Admitting: Family Medicine

## 2023-10-18 ENCOUNTER — Ambulatory Visit
Admission: RE | Admit: 2023-10-18 | Discharge: 2023-10-18 | Disposition: A | Discharge status: Home / Self Care | Payer: BC Managed Care – PPO | Source: Ambulatory Visit | Attending: Family Medicine | Admitting: Family Medicine

## 2023-10-18 DIAGNOSIS — R519 Headache, unspecified: Secondary | ICD-10-CM

## 2023-10-19 ENCOUNTER — Telehealth: Payer: Self-pay

## 2023-10-20 ENCOUNTER — Encounter (INDEPENDENT_AMBULATORY_CARE_PROVIDER_SITE_OTHER): Payer: Self-pay | Admitting: Otolaryngology

## 2023-11-27 ENCOUNTER — Ambulatory Visit (INDEPENDENT_AMBULATORY_CARE_PROVIDER_SITE_OTHER): Payer: 59 | Admitting: Plastic Surgery

## 2023-11-27 ENCOUNTER — Encounter: Payer: Self-pay | Admitting: Plastic Surgery

## 2023-11-27 DIAGNOSIS — L989 Disorder of the skin and subcutaneous tissue, unspecified: Secondary | ICD-10-CM

## 2023-11-27 NOTE — Progress Notes (Signed)
 Referring Provider Rolinda Millman, MD [phone] [address] [place],  [place] [address]   CC:  Chief Complaint  Patient presents with   Consult           Emma Hall is an 58 y.o. female.  HPI: Emma Hall is a 58 year old female who presents today for evaluation of a skin lesion/mass on the right side of her face.  Patient states that she had a small mass on the right side of her face but was hospitalized in November given antibiotics at which time the mass grew rapidly and then drained what sounds like Siebold and pus.  She states that the mass portion has been not been palpable since it drained.  No Known Allergies  Outpatient Encounter Medications as of 11/27/2023  Medication Sig   escitalopram (LEXAPRO) 5 MG tablet Take 5 mg by mouth daily.   ferrous sulfate 325 (65 FE) MG EC tablet Take 325 mg by mouth 3 (three) times daily with meals.   LINZESS  145 MCG CAPS capsule TAKE 1 CAPSULE(145 MCG) BY MOUTH DAILY BEFORE BREAKFAST   Multiple Vitamin (MULTI VITAMIN DAILY PO) Take by mouth.   oxyCODONE -acetaminophen  (PERCOCET/ROXICET) 5-325 MG tablet Take 1-2 tablets by mouth every 6 (six) hours as needed for severe pain.   vitamin B-12 (CYANOCOBALAMIN) 1000 MCG tablet Take 1,000 mcg by mouth daily.   No facility-administered encounter medications on file as of 11/27/2023.     Past Medical History:  Diagnosis Date   Anemia    Arthritis    right knee right hip lower back   Endometriosis    Fibroid    Heart palpitations    Vitamin D  deficiency     Past Surgical History:  Procedure Laterality Date   ABDOMINAL HYSTERECTOMY     COLONOSCOPY WITH PROPOFOL  N/A 03/09/2020   Procedure: COLONOSCOPY WITH PROPOFOL ;  Surgeon: Therisa Bi, MD;  Location[facility] ENDOSCOPY;  Service: Gastroenterology;  Laterality: N/A;   GALLBLADDER SURGERY      Family History  Problem Relation Age of Onset   Healthy Mother    Healthy Father    Breast cancer Maternal Aunt 60   Breast cancer  Maternal Aunt 55   Breast cancer Maternal Aunt 40   Breast cancer Sister    Stroke Sister     Social History   Social History Narrative   Not on file     Review of Systems General: Denies fevers, chills, weight loss CV: Denies chest pain, shortness of breath, palpitations Skin: History of a mass and skin lesion on the right side of the face with recent drainage  Physical Exam    10/03/2023    7:51 AM 10/03/2023    4:57 AM 10/02/2023    8:59 PM  Vitals with BMI  Systolic 130 128 865  Diastolic 77 80 68  Pulse 72 76 80    General:  No acute distress,  Alert and oriented, Non-Toxic, Normal speech and affect Integument there is a small area of irregular skin on the right side of the face but no mass. Mammogram: January 2024 BI-RADS 1 Assessment/Plan Skin lesion/mass: Patient has a history of the mass with drainage on the right side of the face.  This sounds like an epidermal inclusion cyst though there is nothing palpable at the site currently.  We discussed epidermal inclusion cyst in the theory of how they arise.  I have asked her if she feels the mass starting to grow again to return and we will  make arrangements for removal in the office.  Emma Hall 11/27/2023, 10:39 AM

## 2023-11-28 ENCOUNTER — Telehealth (INDEPENDENT_AMBULATORY_CARE_PROVIDER_SITE_OTHER): Payer: Self-pay | Admitting: Otolaryngology

## 2023-11-28 NOTE — Telephone Encounter (Signed)
 LVM to confirm appt & location [id number] afm

## 2023-11-29 ENCOUNTER — Ambulatory Visit (INDEPENDENT_AMBULATORY_CARE_PROVIDER_SITE_OTHER): Payer: 59 | Admitting: Otolaryngology

## 2023-11-29 VITALS — BP 130/87 | HR 95 | Ht 67.0 in | Wt 203.0 lb

## 2023-11-29 DIAGNOSIS — J343 Hypertrophy of nasal turbinates: Secondary | ICD-10-CM | POA: Diagnosis not present

## 2023-11-29 DIAGNOSIS — J324 Chronic pansinusitis: Secondary | ICD-10-CM | POA: Insufficient documentation

## 2023-11-29 DIAGNOSIS — J329 Chronic sinusitis, unspecified: Secondary | ICD-10-CM | POA: Diagnosis not present

## 2023-11-29 DIAGNOSIS — J342 Deviated nasal septum: Secondary | ICD-10-CM | POA: Insufficient documentation

## 2023-11-29 MED ORDER — LEVOFLOXACIN 500 MG PO TABS: 500.0000 mg | ORAL_TABLET | Freq: Every day | ORAL | 0 refills | Status: AC

## 2023-11-29 MED ORDER — PREDNISONE 10 MG (48) PO TBPK: ORAL_TABLET | ORAL | 0 refills | Status: AC

## 2023-11-30 NOTE — Progress Notes (Signed)
Patient ID: Emma Hall, female   DOB: [birth year], 58 y.o.   MRN: [id number]   CC: Chronic rhinosinusitis, right ear pain, chronic nasal congestion  HPI:  Emma Hall is a 58 y.o. female who presents today complaining of chronic sinus infections, chronic nasal congestion, and recurrent right ear pain.  According to the patient, she has been symptomatic for more than 1 year.  Her nasal congestion is worse at night.  She has been having multiple acute exacerbations of her chronic sinusitis over the past year.  She was diagnosed with sepsis in November 2024 due to her sinus infections.  She was admitted to [facility] for IV antibiotics in December 2024.  She has a history of environmental allergies.  She has been using Flonase nasal spray daily for the past year.  She also uses Navage nasal irrigation daily.  She has no previous ENT surgery.  Her head CT scan in December 2024 showed bilateral pansinusitis.  Past Medical History:  Diagnosis Date   Anemia    Arthritis    right knee right hip lower back   Endometriosis    Fibroid    Heart palpitations    Vitamin D deficiency     Past Surgical History:  Procedure Laterality Date   ABDOMINAL HYSTERECTOMY     COLONOSCOPY WITH PROPOFOL N/A 03/09/2020   Procedure: COLONOSCOPY WITH PROPOFOL;  Surgeon: Wyline Mood, MD;  Location: [facility] ENDOSCOPY;  Service: Gastroenterology;  Laterality: N/A;   GALLBLADDER SURGERY      Family History  Problem Relation Age of Onset   Healthy Mother    Healthy Father    Breast cancer Maternal Aunt 51   Breast cancer Maternal Aunt 74   Breast cancer Maternal Aunt 7   Breast cancer Sister    Stroke Sister     Social History:  reports that she has never smoked. She has never used smokeless tobacco. She reports that she does not currently use alcohol. She reports that she does not use drugs.  Allergies:  Allergies  Allergen Reactions   Prednisone Other (See Comments)    "made me feel crazy  and like I couldn't function normally"    Prior to Admission medications   Medication Sig Start Date End Date Taking? Authorizing Provider  escitalopram (LEXAPRO) 5 MG tablet Take 5 mg by mouth daily. 01/22/20  Yes [provider]  ferrous sulfate 325 (65 FE) MG EC tablet Take 325 mg by mouth 3 (three) times daily with meals.   Yes [provider]  levofloxacin (LEVAQUIN) 500 MG tablet Take 1 tablet (500 mg total) by mouth daily for 14 days. 11/29/23 12/13/23 Yes Newman Pies, MD  LINZESS 145 MCG CAPS capsule TAKE 1 CAPSULE(145 MCG) BY MOUTH DAILY BEFORE BREAKFAST 06/02/19  Yes Hildred Laser, MD  Multiple Vitamin (MULTI VITAMIN DAILY PO) Take by mouth.   Yes [provider]  oxyCODONE-acetaminophen (PERCOCET/ROXICET) 5-325 MG tablet Take 1-2 tablets by mouth every 6 (six) hours as needed for severe pain. 08/05/21  Yes Valeria Batman, MD  predniSONE (STERAPRED UNI-PAK 48 TAB) 10 MG (48) TBPK tablet Per instructions 11/29/23  Yes Newman Pies, MD  vitamin B-12 (CYANOCOBALAMIN) 1000 MCG tablet Take 1,000 mcg by mouth daily.   Yes [provider]    Blood pressure 130/87, pulse 95, height 5\' 7"  (1.702 m), weight 203 lb (92.1 kg), SpO2 96%. Exam: General: Communicates without difficulty, well nourished, no acute distress. Head: Normocephalic, no evidence injury, no tenderness, facial buttresses intact  without stepoff. Face/sinus: No tenderness to palpation and percussion. Facial movement is normal and symmetric. Eyes: PERRL, EOMI. No scleral icterus, conjunctivae clear. Neuro: CN II exam reveals vision grossly intact.  No nystagmus at any point of gaze. Ears: Auricles well formed without lesions.  Ear canals are intact without mass or lesion.  No erythema or edema is appreciated.  The TMs are intact without fluid. Nose: External evaluation reveals normal support and skin without lesions.  Dorsum is intact.  Anterior rhinoscopy reveals congested mucosa over anterior aspect of  inferior turbinates and intact septum. Oral:  Oral cavity and oropharynx are intact, symmetric, without erythema or edema.  Mucosa is moist without lesions. Neck: Full range of motion without pain.  There is no significant lymphadenopathy.  No masses palpable.  Thyroid bed within normal limits to palpation.  Parotid glands and submandibular glands equal bilaterally without mass.  Trachea is midline. Neuro:  CN 2-12 grossly intact.   Procedure:  Flexible Nasal Endoscopy: Description: Risks, benefits, and alternatives of flexible endoscopy were explained to the patient.  Specific mention was made of the risk of throat numbness with difficulty swallowing, possible bleeding from the nose and mouth, and pain from the procedure.  The patient gave oral consent to proceed.  The flexible scope was inserted into the right nasal cavity.  Endoscopy of the interior nasal cavity, superior, inferior, and middle meatus was performed. The sphenoid-ethmoid recess was examined. Edematous mucosa was noted.  Polypoid tissue was noted to obstruct the middle meatus.  Mucopurulent drainage was noted.  Nasal septal deviation noted.  Nasopharynx was clear.  Turbinates were hypertrophied but without mass.  The procedure was repeated on the contralateral side with similar findings.  The patient tolerated the procedure well.   Assessment: 1.  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  Both middle meatus are obstructed by polypoid tissue. 2.  Diffusely edematous nasal mucosa, nasal septal deviation, and bilateral inferior turbinate hypertrophy.  Plan: 1.  The physical exam and nasal endoscopy findings are reviewed with the patient. 2.  Continue with Flonase nasal spray daily. 3.  Levofloxacin 500 mg daily for 14 days. 4.  Prednisone Dosepak for 12 days. 5.  Sinus CT scan to evaluate the extent of her chronic sinusitis after she completes her antibiotic. 6.  The patient will return for  reevaluation in 3 to 4 weeks.  Kamori Kitchens W Ayssa Bentivegna 11/30/2023, 11:52 AM

## 2024-01-05 ENCOUNTER — Telehealth (INDEPENDENT_AMBULATORY_CARE_PROVIDER_SITE_OTHER): Payer: Self-pay | Admitting: Otolaryngology

## 2024-01-05 NOTE — Telephone Encounter (Signed)
Left vm to confirm appt and address for 01/08/2024.

## 2024-01-08 ENCOUNTER — Ambulatory Visit (INDEPENDENT_AMBULATORY_CARE_PROVIDER_SITE_OTHER): Payer: 59

## 2024-01-12 ENCOUNTER — Ambulatory Visit
Admission: RE | Admit: 2024-01-12 | Discharge: 2024-01-12 | Disposition: A | Discharge status: Home / Self Care | Payer: 59 | Source: Ambulatory Visit | Attending: Otolaryngology | Admitting: Otolaryngology

## 2024-01-12 DIAGNOSIS — J324 Chronic pansinusitis: Secondary | ICD-10-CM | POA: Diagnosis present

## 2024-01-30 ENCOUNTER — Telehealth (INDEPENDENT_AMBULATORY_CARE_PROVIDER_SITE_OTHER): Payer: Self-pay | Admitting: Otolaryngology

## 2024-01-30 NOTE — Telephone Encounter (Signed)
 LVM to confirm appt & location [phone] afm

## 2024-01-31 ENCOUNTER — Ambulatory Visit (INDEPENDENT_AMBULATORY_CARE_PROVIDER_SITE_OTHER): Payer: 59

## 2024-01-31 ENCOUNTER — Encounter (INDEPENDENT_AMBULATORY_CARE_PROVIDER_SITE_OTHER): Payer: Self-pay

## 2024-01-31 VITALS — BP 135/83 | HR 90 | Ht 67.0 in | Wt 203.0 lb

## 2024-01-31 DIAGNOSIS — J329 Chronic sinusitis, unspecified: Secondary | ICD-10-CM

## 2024-01-31 DIAGNOSIS — J3489 Other specified disorders of nose and nasal sinuses: Secondary | ICD-10-CM

## 2024-01-31 DIAGNOSIS — J343 Hypertrophy of nasal turbinates: Secondary | ICD-10-CM

## 2024-01-31 DIAGNOSIS — J322 Chronic ethmoidal sinusitis: Secondary | ICD-10-CM

## 2024-01-31 DIAGNOSIS — J320 Chronic maxillary sinusitis: Secondary | ICD-10-CM

## 2024-01-31 DIAGNOSIS — J342 Deviated nasal septum: Secondary | ICD-10-CM

## 2024-02-01 DIAGNOSIS — J3489 Other specified disorders of nose and nasal sinuses: Secondary | ICD-10-CM | POA: Insufficient documentation

## 2024-02-01 DIAGNOSIS — J322 Chronic ethmoidal sinusitis: Secondary | ICD-10-CM | POA: Insufficient documentation

## 2024-02-01 DIAGNOSIS — J320 Chronic maxillary sinusitis: Secondary | ICD-10-CM | POA: Insufficient documentation

## 2024-02-01 NOTE — Progress Notes (Signed)
 Patient ID: Emma Hall, female   DOB: 04/13/1966, 58 y.o.   MRN: [id number]  Follow-up: Chronic rhinosinusitis, chronic nasal obstruction  HPI: The patient is a 58 year old female who returns today for her follow-up evaluation.  The patient was previously seen for her chronic rhinosinusitis, chronic nasal congestion, and recurrent right ear pain.  She has been symptomatic for more than 1 year.  She has had multiple acute exacerbations of her chronic rhinosinusitis, including an episode of sepsis in November 2024 that required hospitalization and IV antibiotics.  Over the past 6+ months, she has been treated with multiple antibiotics, using Flonase nasal spray daily, and nasal saline irrigation with the Navage device.  At her last visit, she was noted to have mucopurulent drainage, nasal septal deviation, and bilateral inferior turbinate hypertrophy.  She was treated with 14 days of levofloxacin, and systemic prednisone, in addition to her Flonase and nasal saline irrigation.  She subsequently underwent a sinus CT scan.  The CT showed chronic left maxillary and ethmoid sinusitis, with heterogeneous density within the left maxillary sinus, suggesting of calcification and possible fungus ball.  In addition, the chronic infection has resulted in expansion of the left medial maxillary wall, bulging into the left nasal cavity.  The CT also showed significant nasal septal deviation, bilateral inferior turbinate hypertrophy, and a large right concha bullosa.  The patient returns today complaining of persistent facial pain, nasal obstruction, and purulent nasal drainage.  The patient has not responded to maximal medical therapy.  Exam: General: Communicates without difficulty, well nourished, no acute distress. Head: Normocephalic, no evidence injury, no tenderness, facial buttresses intact without stepoff. Face/sinus: No tenderness to palpation and percussion. Facial movement is normal and symmetric. Eyes: PERRL,  EOMI. No scleral icterus, conjunctivae clear. Neuro: CN II exam reveals vision grossly intact.  No nystagmus at any point of gaze. Ears: Auricles well formed without lesions.  Ear canals are intact without mass or lesion.  No erythema or edema is appreciated.  The TMs are intact without fluid. Nose: External evaluation reveals normal support and skin without lesions.  Dorsum is intact.  Anterior rhinoscopy reveals congested and erythematous mucosa over anterior aspect of inferior turbinates and intact septum.  Polypoid tissue is noted to obstruct the left middle meatus.  Oral:  Oral cavity and oropharynx are intact, symmetric, without erythema or edema.  Mucosa is moist without lesions. Neck: Full range of motion without pain.  There is no significant lymphadenopathy.  No masses palpable.  Thyroid bed within normal limits to palpation.  Parotid glands and submandibular glands equal bilaterally without mass.  Trachea is midline. Neuro:  CN 2-12 grossly intact.    Assessment: 1.  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  Heterogeneous density is noted within the left maxillary sinus, consistent with calcification and likely fungus balls. 2.  Chronic nasal obstruction, secondary to nasal septal deviation, right concha bullosa, and bilateral inferior turbinate hypertrophy.  More than 95% of her nasal passageways are obstructed bilaterally.  Plan: 1.  The physical exam findings and the CT images are extensively reviewed with the patient. 2.  Continue with Flonase nasal sprays and nasal saline irrigation daily. 3.  The patient was treated with multiple courses of antibiotics, including a recent 2-week course of levofloxacin and 12 days of high-dose prednisone. 4.  Based on the above findings, the patient will benefit from surgical intervention with septoplasty, bilateral turbinate reduction, right concha bullosa resection, and endoscopic sinus surgery addressing the left  maxillary and ethmoid sinuses.  The risk, benefits, alternatives, and details of the procedures are extensively discussed.  Questions were invited and answered. 5.  The patient would like to proceed with the procedures.

## 2024-02-29 ENCOUNTER — Telehealth: Payer: Self-pay

## 2024-02-29 ENCOUNTER — Other Ambulatory Visit: Payer: Self-pay

## 2024-02-29 DIAGNOSIS — Z860100 Personal history of colon polyps, unspecified: Secondary | ICD-10-CM

## 2024-02-29 MED ORDER — NA SULFATE-K SULFATE-MG SULF 17.5-3.13-1.6 GM/177ML PO SOLN: 1.0000 | Freq: Once | ORAL | 0 refills | Status: AC

## 2024-02-29 NOTE — Telephone Encounter (Signed)
 Gastroenterology Pre-Procedure Review  Request Date: 03/27/24 Requesting Physician: Dr. Antony Baumgartner  PATIENT REVIEW QUESTIONS: The patient responded to the following health history questions as indicated:    1. Are you having any GI issues? no 2. Do you have a personal history of Polyps? yes (last colonoscopy performed by Dr. Antony Baumgartner 02/18/2020) 3. Do you have a family history of Colon Cancer or Polyps? no 4. Diabetes Mellitus? no 5. Joint replacements in the past 12 months?no 6. Major health problems in the past 3 months?no 7. Any artificial heart valves, MVP, or defibrillator?no    MEDICATIONS & ALLERGIES:    Patient reports the following regarding taking any anticoagulation/antiplatelet therapy:   Plavix, Coumadin, Eliquis, Xarelto, Lovenox, Pradaxa, Brilinta, or Effient? no Aspirin? no  Patient confirms/reports the following medications:  Current Outpatient Medications  Medication Sig Dispense Refill   Na Sulfate-K Sulfate-Mg Sulfate concentrate (SUPREP) 17.5-3.13-1.6 GM/177ML SOLN Take 1 kit (354 mLs total) by mouth once for 1 dose. 354 mL 0   escitalopram (LEXAPRO) 5 MG tablet Take 5 mg by mouth daily.     ferrous sulfate 325 (65 FE) MG EC tablet Take 325 mg by mouth 3 (three) times daily with meals.     LINZESS 145 MCG CAPS capsule TAKE 1 CAPSULE(145 MCG) BY MOUTH DAILY BEFORE BREAKFAST 30 capsule 11   Multiple Vitamin (MULTI VITAMIN DAILY PO) Take by mouth.     oxyCODONE-acetaminophen (PERCOCET/ROXICET) 5-325 MG tablet Take 1-2 tablets by mouth every 6 (six) hours as needed for severe pain. 30 tablet 0   predniSONE (STERAPRED UNI-PAK 48 TAB) 10 MG (48) TBPK tablet Per instructions 48 tablet 0   vitamin B-12 (CYANOCOBALAMIN) 1000 MCG tablet Take 1,000 mcg by mouth daily.     No current facility-administered medications for this visit.    Patient confirms/reports the following allergies:  Allergies  Allergen Reactions   Butalbital-Apap-Caffeine Other (See Comments)   Prednisone  Other (See Comments)    "made me feel crazy and like I couldn't function normally"    No orders of the defined types were placed in this encounter.   AUTHORIZATION INFORMATION Primary Insurance: 1D#: Group #:  Secondary Insurance: 1D#: Group #:  SCHEDULE INFORMATION: Date: 03/27/24 Time: Location: [facility]

## 2024-02-29 NOTE — Telephone Encounter (Signed)
 Pt requesting call back to schedule colonoscopy.

## 2024-02-29 NOTE — Telephone Encounter (Signed)
 okay

## 2024-03-08 ENCOUNTER — Other Ambulatory Visit: Payer: Self-pay | Admitting: Family Medicine

## 2024-03-08 DIAGNOSIS — Z1231 Encounter for screening mammogram for malignant neoplasm of breast: Secondary | ICD-10-CM

## 2024-03-12 ENCOUNTER — Ambulatory Visit
Admission: RE | Admit: 2024-03-12 | Discharge: 2024-03-12 | Disposition: A | Discharge status: Home / Self Care | Source: Ambulatory Visit

## 2024-03-12 DIAGNOSIS — Z1231 Encounter for screening mammogram for malignant neoplasm of breast: Secondary | ICD-10-CM

## 2024-03-18 ENCOUNTER — Ambulatory Visit (INDEPENDENT_AMBULATORY_CARE_PROVIDER_SITE_OTHER)

## 2024-03-20 ENCOUNTER — Encounter (HOSPITAL_COMMUNITY): Payer: Self-pay

## 2024-03-27 ENCOUNTER — Encounter
Admission: RE | Disposition: A | Discharge status: Home / Self Care | Payer: Self-pay | Source: Home / Self Care | Attending: Gastroenterology

## 2024-03-27 ENCOUNTER — Ambulatory Visit: Admitting: Anesthesiology

## 2024-03-27 ENCOUNTER — Ambulatory Visit
Admission: RE | Admit: 2024-03-27 | Discharge: 2024-03-27 | Disposition: A | Discharge status: Home / Self Care | Attending: Gastroenterology | Admitting: Gastroenterology

## 2024-03-27 DIAGNOSIS — D120 Benign neoplasm of cecum: Secondary | ICD-10-CM | POA: Diagnosis not present

## 2024-03-27 DIAGNOSIS — K219 Gastro-esophageal reflux disease without esophagitis: Secondary | ICD-10-CM | POA: Insufficient documentation

## 2024-03-27 DIAGNOSIS — Z09 Encounter for follow-up examination after completed treatment for conditions other than malignant neoplasm: Secondary | ICD-10-CM | POA: Insufficient documentation

## 2024-03-27 DIAGNOSIS — D126 Benign neoplasm of colon, unspecified: Secondary | ICD-10-CM

## 2024-03-27 DIAGNOSIS — Z1211 Encounter for screening for malignant neoplasm of colon: Secondary | ICD-10-CM | POA: Diagnosis not present

## 2024-03-27 DIAGNOSIS — Z860100 Personal history of colon polyps, unspecified: Secondary | ICD-10-CM

## 2024-03-27 SURGERY — COLONOSCOPY
Anesthesia: General

## 2024-03-27 MED ORDER — PROPOFOL 1000 MG/100ML IV EMUL
INTRAVENOUS | Status: AC
  Filled 2024-03-27: qty 100

## 2024-03-27 MED ORDER — PROPOFOL 10 MG/ML IV BOLUS
INTRAVENOUS | Status: DC | PRN
  Administered 2024-03-27 (×2): 50 mg via INTRAVENOUS

## 2024-03-27 MED ORDER — SODIUM CHLORIDE 0.9 % IV SOLN
INTRAVENOUS | Status: DC
  Administered 2024-03-27: 20 mL/h via INTRAVENOUS

## 2024-03-27 MED ORDER — LIDOCAINE HCL (CARDIAC) PF 100 MG/5ML IV SOSY
PREFILLED_SYRINGE | INTRAVENOUS | Status: DC | PRN
  Administered 2024-03-27: 80 mg via INTRAVENOUS

## 2024-03-27 MED ORDER — DEXMEDETOMIDINE HCL IN NACL 80 MCG/20ML IV SOLN
INTRAVENOUS | Status: DC | PRN
  Administered 2024-03-27: 20 ug via INTRAVENOUS

## 2024-03-27 MED ORDER — LIDOCAINE HCL (PF) 2 % IJ SOLN
INTRAMUSCULAR | Status: AC
  Filled 2024-03-27: qty 5

## 2024-03-27 MED ORDER — PROPOFOL 500 MG/50ML IV EMUL
INTRAVENOUS | Status: DC | PRN
  Administered 2024-03-27: 75 ug/kg/min via INTRAVENOUS

## 2024-03-27 MED ORDER — EPHEDRINE SULFATE-NACL 50-0.9 MG/10ML-% IV SOSY
PREFILLED_SYRINGE | INTRAVENOUS | Status: DC | PRN
  Administered 2024-03-27: 10 mg via INTRAVENOUS
  Administered 2024-03-27: 15 mg via INTRAVENOUS

## 2024-03-27 NOTE — Transfer of Care (Signed)
 Immediate Anesthesia Transfer of Care Note  Patient: Emma Hall  Procedure(s) Performed: COLONOSCOPY POLYPECTOMY, INTESTINE  Patient Location: PACU  Anesthesia Type:General  Level of Consciousness: sedated  Airway & Oxygen Therapy: Patient Spontanous Breathing  Post-op Assessment: Report given to RN and Post -op Vital signs reviewed and stable  Post vital signs: Reviewed and stable  Last Vitals:  Vitals Value Taken Time  BP    Temp    Pulse    Resp    SpO2      Last Pain:  Vitals:   03/27/24 1103  TempSrc: Temporal  PainSc: 0-No pain         Complications: No notable events documented.

## 2024-03-27 NOTE — H&P (Signed)
 Emma Salaam, MD [address], [place], [place], [address] [address], [place], [place], [address] Phone: [phone]  Fax: [phone]  Primary Care Physician:  Dorena Gander, MD   Pre-Procedure History & Physical: HPI:  Emma Hall is a 58 y.o. female is here for an colonoscopy.   Past Medical History:  Diagnosis Date   Anemia    Arthritis    right knee right hip lower back   Endometriosis    Fibroid    Heart palpitations    Vitamin D  deficiency     Past Surgical History:  Procedure Laterality Date   ABDOMINAL HYSTERECTOMY     COLONOSCOPY WITH PROPOFOL  N/A 03/09/2020   Procedure: COLONOSCOPY WITH PROPOFOL ;  Surgeon: Emma Salaam, MD;  Location: [facility] ENDOSCOPY;  Service: Gastroenterology;  Laterality: N/A;   GALLBLADDER SURGERY      Prior to Admission medications   Medication Sig Start Date End Date Taking? Authorizing Provider  escitalopram (LEXAPRO) 5 MG tablet Take 5 mg by mouth daily. 01/22/20  Yes [provider]  ferrous sulfate 325 (65 FE) MG EC tablet Take 325 mg by mouth 3 (three) times daily with meals.   Yes [provider]  LINZESS  145 MCG CAPS capsule TAKE 1 CAPSULE(145 MCG) BY MOUTH DAILY BEFORE BREAKFAST 06/02/19  Yes Teresa Fender, MD  Multiple Vitamin (MULTI VITAMIN DAILY PO) Take by mouth.   Yes [provider]  oxyCODONE -acetaminophen  (PERCOCET/ROXICET) 5-325 MG tablet Take 1-2 tablets by mouth every 6 (six) hours as needed for severe pain. 08/05/21  Yes Shirlee Dotter, MD  predniSONE  (STERAPRED UNI-PAK 48 TAB) 10 MG (48) TBPK tablet Per instructions 11/29/23  Yes Reynold Caves, MD  vitamin B-12 (CYANOCOBALAMIN) 1000 MCG tablet Take 1,000 mcg by mouth daily.   Yes [provider]    Allergies as of 02/29/2024 - Review Complete 01/31/2024  Allergen Reaction Noted   Butalbital -apap-caffeine  Other (See Comments) 02/29/2024   Prednisone  Other (See Comments) 06/03/2021    Family History  Problem  Relation Age of Onset   Healthy Mother    Healthy Father    Breast cancer Maternal Aunt 48   Breast cancer Maternal Aunt 49   Breast cancer Maternal Aunt 6   Breast cancer Sister    Stroke Sister     Social History   Socioeconomic History   Marital status: Single    Spouse name: Not on file   Number of children: 2   Years of education: Not on file   Highest education level: Not on file  Occupational History   Not on file  Tobacco Use   Smoking status: Never   Smokeless tobacco: Never  Vaping Use   Vaping status: Never Used  Substance and Sexual Activity   Alcohol use: Not Currently    Comment: occass   Drug use: No   Sexual activity: Not Currently    Birth control/protection: Surgical  Other Topics Concern   Not on file  Social History Narrative   Not on file   Social Drivers of Health   Financial Resource Strain: Not on file  Food Insecurity: No Food Insecurity (10/01/2023)   Hunger Vital Sign    Worried About Running Out of Food in the Last Year: Never true    Ran Out of Food in the Last Year: Never true  Transportation Needs: Unknown (10/01/2023)   PRAPARE - Administrator, Civil Service (Medical): No    Lack of Transportation (Non-Medical): Patient declined  Physical Activity: Not on file  Stress: Not on file  Social Connections: Not on file  Intimate Partner Violence: Not At Risk (10/01/2023)   Humiliation, Afraid, Rape, and Kick questionnaire    Fear of Current or Ex-Partner: No    Emotionally Abused: No    Physically Abused: No    Sexually Abused: No    Review of Systems: See HPI, otherwise negative ROS  Physical Exam: There were no vitals taken for this visit. General:   Alert,  pleasant and cooperative in NAD Head:  Normocephalic and atraumatic. Neck:  Supple; no masses or thyromegaly. Lungs:  Clear throughout to auscultation, normal respiratory effort.    Heart:  +S1, +S2, Regular rate and rhythm, No edema. Abdomen:  Soft,  nontender and nondistended. Normal bowel sounds, without guarding, and without rebound.   Neurologic:  Alert and  oriented x4;  grossly normal neurologically.  Impression/Plan: Emma Hall is here for an colonoscopy to be performed for surveillance due to prior history of colon polyps   Risks, benefits, limitations, and alternatives regarding  colonoscopy have been reviewed with the patient.  Questions have been answered.  All parties agreeable.   Emma Salaam, MD  03/27/2024, 11:03 AM

## 2024-03-27 NOTE — Anesthesia Preprocedure Evaluation (Addendum)
 Anesthesia Evaluation  Patient identified by MRN, date of birth, ID band Patient awake    Reviewed: Allergy & Precautions, NPO status , Patient's Chart, lab work & pertinent test results  History of Anesthesia Complications Negative for: history of anesthetic complications  Airway Mallampati: I   Neck ROM: Full    Dental  (+) Missing   Pulmonary neg pulmonary ROS   Pulmonary exam normal breath sounds clear to auscultation       Cardiovascular Exercise Tolerance: Good Normal cardiovascular exam Rhythm:Regular Rate:Normal  ECG 09/30/23:  Sinus rhythm Probable left atrial enlargement Low voltage, precordial leads Borderline T abnormalities, anterior leads Prolonged QT interval   Neuro/Psych negative neurological ROS     GI/Hepatic ,GERD  ,,  Endo/Other  negative endocrine ROS    Renal/GU negative Renal ROS     Musculoskeletal  (+) Arthritis ,    Abdominal   Peds  Hematology  (+) Blood dyscrasia, anemia   Anesthesia Other Findings   Reproductive/Obstetrics Endometriosis s/p hysterectomy                             Anesthesia Physical Anesthesia Plan  ASA: 2  Anesthesia Plan: General   Post-op Pain Management:    Induction: Intravenous  PONV Risk Score and Plan: 3 and Propofol  infusion, TIVA and Treatment may vary due to age or medical condition  Airway Management Planned: Natural Airway  Additional Equipment:   Intra-op Plan:   Post-operative Plan:   Informed Consent: I have reviewed the patients History and Physical, chart, labs and discussed the procedure including the risks, benefits and alternatives for the proposed anesthesia with the patient or authorized representative who has indicated his/her understanding and acceptance.       Plan Discussed with: CRNA  Anesthesia Plan Comments: (LMA/GETA backup discussed.  Patient consented for risks of anesthesia  including but not limited to:  - adverse reactions to medications - damage to eyes, teeth, lips or other oral mucosa - nerve damage due to positioning  - sore throat or hoarseness - damage to heart, brain, nerves, lungs, other parts of body or loss of life  Informed patient about role of CRNA in peri- and intra-operative care.  Patient voiced understanding.)        Anesthesia Quick Evaluation

## 2024-03-27 NOTE — Anesthesia Postprocedure Evaluation (Signed)
 Anesthesia Post Note  Patient: Emma Hall  Procedure(s) Performed: COLONOSCOPY POLYPECTOMY, INTESTINE  Patient location during evaluation: PACU Anesthesia Type: General Level of consciousness: awake and alert, oriented and patient cooperative Pain management: pain level controlled Vital Signs Assessment: post-procedure vital signs reviewed and stable Respiratory status: spontaneous breathing, nonlabored ventilation and respiratory function stable Cardiovascular status: blood pressure returned to baseline and stable Postop Assessment: adequate PO intake Anesthetic complications: no   No notable events documented.   Last Vitals:  Vitals:   03/27/24 1213 03/27/24 1225  BP: 94/64 102/66  Pulse: 93 90  Resp: 16 17  Temp:    SpO2: 100% 100%    Last Pain:  Vitals:   03/27/24 1225  TempSrc:   PainSc: 0-No pain                 Dorothey Gate

## 2024-03-27 NOTE — Op Note (Signed)
 [facility] Gastroenterology Patient Name: Emma Hall Procedure Date: 03/27/2024 11:29 AM MRN: [id number] Account #: [id number] Date of Birth: 1966/04/05 Admit Type: Outpatient Age: 58 Room: [facility] ENDO ROOM 3 Gender: Female Note Status: Finalized Instrument Name: Charlyn Cooley [id number] Procedure:             Colonoscopy Indications:           Surveillance: Personal history of adenomatous polyps                         on last colonoscopy 3 years ago, Last colonoscopy:                         April 2021 Providers:             Luke Salaam MD, MD Referring MD:          No Local Md, MD (Referring MD) Medicines:             Monitored Anesthesia Care Complications:         No immediate complications. Procedure:             Pre-Anesthesia Assessment:                        - The risks and benefits of the procedure and the                         sedation options and risks were discussed with the                         patient. All questions were answered and informed                         consent was obtained.                        - ASA Grade Assessment: II - A patient with mild                         systemic disease.                        After obtaining informed consent, the colonoscope was                         passed under direct vision. Throughout the procedure,                         the patient's blood pressure, pulse, and oxygen                         saturations were monitored continuously. The                         Colonoscope was introduced through the anus and                         advanced to the the cecum, identified by the                         appendiceal orifice.  The colonoscopy was performed                         with ease. The patient tolerated the procedure well.                         The quality of the bowel preparation was good. The                         ileocecal valve, appendiceal orifice, and rectum were                          photographed. Findings:      The perianal and digital rectal examinations were normal.      A 5 mm polyp was found in the cecum. The polyp was sessile. The polyp       was removed with a cold snare. Resection and retrieval were complete.      The exam was otherwise without abnormality on direct and retroflexion       views. Impression:            - One 5 mm polyp in the cecum, removed with a cold                         snare. Resected and retrieved.                        - The examination was otherwise normal on direct and                         retroflexion views. Recommendation:        - Discharge patient to home (with escort).                        - Resume previous diet.                        - Continue present medications.                        - Await pathology results.                        - Repeat colonoscopy in 5 years for surveillance. Procedure Code(s):     --- Professional ---                        [phone], Colonoscopy, flexible; with removal of                         tumor(s), polyp(s), or other lesion(s) by snare                         technique Diagnosis Code(s):     --- Professional ---                        Z86.010, Personal history of colonic polyps                        D12.0, Benign neoplasm of cecum CPT copyright 2022 American  Medical Association. All rights reserved. The codes documented in this report are preliminary and upon coder review may  be revised to meet current compliance requirements. Luke Salaam, MD Luke Salaam MD, MD 03/27/2024 12:00:58 PM This report has been signed electronically. Number of Addenda: 0 Note Initiated On: 03/27/2024 11:29 AM Scope Withdrawal Time: 0 hours 10 minutes 55 seconds  Total Procedure Duration: 0 hours 16 minutes 5 seconds  Estimated Blood Loss:  Estimated blood loss: none.      [facility]

## 2024-03-28 ENCOUNTER — Encounter: Payer: Self-pay | Admitting: Gastroenterology

## 2024-03-28 ENCOUNTER — Ambulatory Visit: Payer: Self-pay | Admitting: Gastroenterology

## 2024-03-28 LAB — SURGICAL PATHOLOGY

## 2024-12-16 ENCOUNTER — Ambulatory Visit: Admitting: Student

## 2024-12-18 ENCOUNTER — Ambulatory Visit: Admitting: Student

## 2024-12-18 ENCOUNTER — Encounter: Payer: Self-pay | Admitting: Student

## 2024-12-18 VITALS — BP 135/85 | HR 93 | Ht 67.0 in | Wt 201.2 lb

## 2024-12-18 DIAGNOSIS — L989 Disorder of the skin and subcutaneous tissue, unspecified: Secondary | ICD-10-CM | POA: Diagnosis not present

## 2024-12-18 NOTE — Progress Notes (Cosign Needed)
"  ° °  Referring Provider Emma Millman, MD [phone] Emma Hall [address] [place],  Emma Hall [address]   CC:  Chief Complaint  Patient presents with   Follow-up      Emma Hall is an 59 y.o. female.  HPI: Patient is a 59 year old female who presents to the clinic today for cyst on her face.  Patient was last seen in the clinic on 11/27/2023 by Dr. Waddell.  At this visit, patient reported that she had a small mass on the right side of her face, but when she was hospitalized the previous November, the mass grew rapidly and then drained.  The mass portion had not been palpable since it drained.  On exam, there was a small area of irregular skin on the right side of the face, but no mass.  It was noted that this was most likely an epidermal inclusion cyst, but there is nothing palpable at the site at the time of exam.  It was discussed with the patient that when she starts feeling the mass to go again to return to the office.  Today, patient reports that the cyst to her right cheek has returned.  She states that it came up about a month and a half ago and has been slowly growing.  She states that it has not drained or become infected.  She denies any other issues or concerns.  She denies any history of cardiac disease or taking any blood thinners.  Patient reports she is not a diabetic and does not smoke.  Review of Systems General: Denies any fevers or chills  Physical Exam    12/18/2024    2:20 PM 03/27/2024   12:25 PM 03/27/2024   12:13 PM  Vitals with BMI  Height 5' 7    Weight 201 lbs 3 oz    BMI 31.51    Systolic 135 102 94  Diastolic 85 66 64  Pulse 93 90 93    General:  No acute distress,  Alert and oriented, Non-Toxic, Normal speech and affect On exam, patient sitting upright in no acute distress.  To the right preauricular area, there is a 1.5 x 1.25 cm mass noted.  It is soft and mobile.  There is no tenderness to palpation.  No active drainage on exam.  No overlying erythema.   It does not appear to be infected.  Assessment/Plan Skin lesion - Plan: Ambulatory Referral For Surgery Scheduling   Discussed with the patient that this is most likely an epidermal cyst as discussed at her initial visit.  Discussed with her that she can most likely have this excised in the office with Dr. Waddell.  I did discuss with her though that it have to confirm it with him.  She expressed understanding.  I did discuss today's appointment and reviewed the photos with Dr. Waddell.  He is agreeable to excise the cyst in the office.  We will go ahead and get the patient set up for the procedure.  I instructed the patient to call if she has any questions or concerns about anything.  Pictures were obtained of the patient and placed in the chart with the patient's or guardian's permission.   Emma Hall 12/18/2024, 4:58 PM         "

## 2025-01-08 ENCOUNTER — Ambulatory Visit: Admitting: Plastic Surgery
